# Patient Record
Sex: Female | Born: 1970 | Race: White | Hispanic: No | Marital: Married | Smoking: Former smoker
Health system: Southern US, Community
[De-identification: ages and names within clinical notes are randomized; demographics above are authoritative.]

## PROBLEM LIST (undated history)

## (undated) DIAGNOSIS — M5136 Other intervertebral disc degeneration, lumbar region: Secondary | ICD-10-CM

## (undated) DIAGNOSIS — M51369 Other intervertebral disc degeneration, lumbar region without mention of lumbar back pain or lower extremity pain: Secondary | ICD-10-CM

## (undated) DIAGNOSIS — T7840XA Allergy, unspecified, initial encounter: Secondary | ICD-10-CM

## (undated) DIAGNOSIS — F32A Depression, unspecified: Secondary | ICD-10-CM

## (undated) DIAGNOSIS — E785 Hyperlipidemia, unspecified: Secondary | ICD-10-CM

## (undated) DIAGNOSIS — M1990 Unspecified osteoarthritis, unspecified site: Secondary | ICD-10-CM

## (undated) DIAGNOSIS — K219 Gastro-esophageal reflux disease without esophagitis: Secondary | ICD-10-CM

## (undated) DIAGNOSIS — F419 Anxiety disorder, unspecified: Secondary | ICD-10-CM

## (undated) HISTORY — DX: Gastro-esophageal reflux disease without esophagitis: K21.9

## (undated) HISTORY — DX: Depression, unspecified: F32.A

## (undated) HISTORY — DX: Allergy, unspecified, initial encounter: T78.40XA

## (undated) HISTORY — DX: Other intervertebral disc degeneration, lumbar region: M51.36

## (undated) HISTORY — DX: Unspecified osteoarthritis, unspecified site: M19.90

## (undated) HISTORY — DX: Hyperlipidemia, unspecified: E78.5

## (undated) HISTORY — DX: Anxiety disorder, unspecified: F41.9

## (undated) HISTORY — DX: Other intervertebral disc degeneration, lumbar region without mention of lumbar back pain or lower extremity pain: M51.369

## (undated) HISTORY — PX: HERNIA REPAIR: SHX51

---

## 2013-12-05 LAB — HM PAP SMEAR: HM PAP: NORMAL

## 2016-09-06 ENCOUNTER — Encounter: Payer: Self-pay | Admitting: Family Medicine

## 2016-09-06 ENCOUNTER — Ambulatory Visit (INDEPENDENT_AMBULATORY_CARE_PROVIDER_SITE_OTHER): Payer: 59 | Admitting: Family Medicine

## 2016-09-06 VITALS — BP 123/80 | HR 79 | Temp 98.2°F | Ht 64.3 in | Wt 197.2 lb

## 2016-09-06 DIAGNOSIS — Z23 Encounter for immunization: Secondary | ICD-10-CM | POA: Diagnosis not present

## 2016-09-06 DIAGNOSIS — F411 Generalized anxiety disorder: Secondary | ICD-10-CM | POA: Diagnosis not present

## 2016-09-06 DIAGNOSIS — F419 Anxiety disorder, unspecified: Secondary | ICD-10-CM | POA: Insufficient documentation

## 2016-09-06 MED ORDER — BUSPIRONE HCL 5 MG PO TABS: ORAL_TABLET | ORAL | 1 refills | Status: DC

## 2016-09-06 NOTE — Patient Instructions (Signed)
Tdap Vaccine (Tetanus, Diphtheria and Pertussis): What You Need to Know 1. Why get vaccinated? Tetanus, diphtheria and pertussis are very serious diseases. Tdap vaccine can protect us from these diseases. And, Tdap vaccine given to pregnant women can protect newborn babies against pertussis. TETANUS (Lockjaw) is rare in the [place] today. It causes painful muscle tightening and stiffness, usually all over the body.  It can lead to tightening of muscles in the head and neck so you can't open your mouth, swallow, or sometimes even breathe. Tetanus kills about 1 out of 10 people who are infected even after receiving the best medical care. DIPHTHERIA is also rare in the [place] today. It can cause a thick coating to form in the back of the throat.  It can lead to breathing problems, heart failure, paralysis, and death. PERTUSSIS (Whooping Cough) causes severe coughing spells, which can cause difficulty breathing, vomiting and disturbed sleep.  It can also lead to weight loss, incontinence, and rib fractures. Up to 2 in 100 adolescents and 5 in 100 adults with pertussis are hospitalized or have complications, which could include pneumonia or death. These diseases are caused by bacteria. Diphtheria and pertussis are spread from person to person through secretions from coughing or sneezing. Tetanus enters the body through cuts, scratches, or wounds. Before vaccines, as many as 200,000 cases of diphtheria, 200,000 cases of pertussis, and hundreds of cases of tetanus, were reported in the [place] each year. Since vaccination began, reports of cases for tetanus and diphtheria have dropped by about 99% and for pertussis by about 80%. 2. Tdap vaccine Tdap vaccine can protect adolescents and adults from tetanus, diphtheria, and pertussis. One dose of Tdap is routinely given at age 11 or 12. People who did not get Tdap at that age should get it as soon as possible. Tdap is especially important  for healthcare professionals and anyone having close contact with a baby younger than 12 months. Pregnant women should get a dose of Tdap during every pregnancy, to protect the newborn from pertussis. Infants are most at risk for severe, life-threatening complications from pertussis. Another vaccine, called Td, protects against tetanus and diphtheria, but not pertussis. A Td booster should be given every 10 years. Tdap may be given as one of these boosters if you have never gotten Tdap before. Tdap may also be given after a severe cut or burn to prevent tetanus infection. Your doctor or the person giving you the vaccine can give you more information. Tdap may safely be given at the same time as other vaccines. 3. Some people should not get this vaccine  A person who has ever had a life-threatening allergic reaction after a previous dose of any diphtheria, tetanus or pertussis containing vaccine, OR has a severe allergy to any part of this vaccine, should not get Tdap vaccine. Tell the person giving the vaccine about any severe allergies.  Anyone who had coma or long repeated seizures within 7 days after a childhood dose of DTP or DTaP, or a previous dose of Tdap, should not get Tdap, unless a cause other than the vaccine was found. They can still get Td.  Talk to your doctor if you:  have seizures or another nervous system problem,  had severe pain or swelling after any vaccine containing diphtheria, tetanus or pertussis,  ever had a condition called Guillain-Barr Syndrome (GBS),  aren't feeling well on the day the shot is scheduled. 4. Risks With any medicine, including vaccines, there is   a chance of side effects. These are usually mild and go away on their own. Serious reactions are also possible but are rare. Most people who get Tdap vaccine do not have any problems with it. Mild problems following Tdap: (Did not interfere with activities)  Pain where the shot was given (about 3 in 4  adolescents or 2 in 3 adults)  Redness or swelling where the shot was given (about 1 person in 5)  Mild fever of at least 100.31F (up to about 1 in 25 adolescents or 1 in 100 adults)  Headache (about 3 or 4 people in 10)  Tiredness (about 1 person in 3 or 4)  Nausea, vomiting, diarrhea, stomach ache (up to 1 in 4 adolescents or 1 in 10 adults)  Chills, sore joints (about 1 person in 10)  Body aches (about 1 person in 3 or 4)  Rash, swollen glands (uncommon) Moderate problems following Tdap: (Interfered with activities, but did not require medical attention)  Pain where the shot was given (up to 1 in 5 or 6)  Redness or swelling where the shot was given (up to about 1 in 16 adolescents or 1 in 12 adults)  Fever over 102F (about 1 in 100 adolescents or 1 in 250 adults)  Headache (about 1 in 7 adolescents or 1 in 10 adults)  Nausea, vomiting, diarrhea, stomach ache (up to 1 or 3 people in 100)  Swelling of the entire arm where the shot was given (up to about 1 in 500). Severe problems following Tdap: (Unable to perform usual activities; required medical attention)  Swelling, severe pain, bleeding and redness in the arm where the shot was given (rare). Problems that could happen after any vaccine:  People sometimes faint after a medical procedure, including vaccination. Sitting or lying down for about 15 minutes can help prevent fainting, and injuries caused by a fall. Tell your doctor if you feel dizzy, or have vision changes or ringing in the ears.  Some people get severe pain in the shoulder and have difficulty moving the arm where a shot was given. This happens very rarely.  Any medication can cause a severe allergic reaction. Such reactions from a vaccine are very rare, estimated at fewer than 1 in a million doses, and would happen within a few minutes to a few hours after the vaccination. As with any medicine, there is a very remote chance of a vaccine causing a serious  injury or death. The safety of vaccines is always being monitored. For more information, visit: http://www.aguilar.org/ 5. What if there is a serious problem? What should I look for? Look for anything that concerns you, such as signs of a severe allergic reaction, very high fever, or unusual behavior. Signs of a severe allergic reaction can include hives, swelling of the face and throat, difficulty breathing, a fast heartbeat, dizziness, and weakness. These would usually start a few minutes to a few hours after the vaccination. What should I do?  If you think it is a severe allergic reaction or other emergency that can't wait, call 9-1-1 or get the person to the nearest hospital. Otherwise, call your doctor.  Afterward, the reaction should be reported to the Vaccine Adverse Event Reporting System (VAERS). Your doctor might file this report, or you can do it yourself through the VAERS web site at www.vaers.SamedayNews.es, or by calling [phone].  VAERS does not give medical advice. 6. The National Vaccine Injury Compensation Program The [employer] Vaccine Injury Compensation Program ([place]) is  a federal program that was created to compensate people who may have been injured by certain vaccines. Persons who believe they may have been injured by a vaccine can learn about the program and about filing a claim by calling 279 738 7166 or visiting the [place] website at GoldCloset.com.ee. There is a time limit to file a claim for compensation. 7. How can I learn more?  Ask your doctor. He or she can give you the vaccine package insert or suggest other sources of information.  Call your local or state health department.  Contact the Centers for Disease Control and Prevention (CDC):  Call (414)219-8622 (1-800-CDC-INFO) or  Visit CDC's website at http://hunter.com/ CDC Tdap Vaccine VIS (11/16/13) This information is not intended to replace advice given to you by your health care  provider. Make sure you discuss any questions you have with your health care provider. Document Released: 03/10/2012 Document Revised: 05/30/2016 Document Reviewed: 05/30/2016 Elsevier Interactive Patient Education  2017 [place]. Influenza (Flu) Vaccine (Inactivated or Recombinant): What You Need to Know 1. Why get vaccinated? Influenza ("flu") is a contagious disease that spreads around the [place] every year, usually between October and May. Flu is caused by influenza viruses, and is spread mainly by coughing, sneezing, and close contact. Anyone can get flu. Flu strikes suddenly and can last several days. Symptoms vary by age, but can include:  fever/chills  sore throat  muscle aches  fatigue  cough  headache  runny or stuffy nose Flu can also lead to pneumonia and blood infections, and cause diarrhea and seizures in children. If you have a medical condition, such as heart or lung disease, flu can make it worse. Flu is more dangerous for some people. Infants and young children, people 38 years of age and older, pregnant women, and people with certain health conditions or a weakened immune system are at greatest risk. Each year thousands of people in the [place] die from flu, and many more are hospitalized. Flu vaccine can:  keep you from getting flu,  make flu less severe if you do get it, and  keep you from spreading flu to your family and other people. 2. Inactivated and recombinant flu vaccines A dose of flu vaccine is recommended every flu season. Children 6 months through 58 years of age may need two doses during the same flu season. Everyone else needs only one dose each flu season. Some inactivated flu vaccines contain a very small amount of a mercury-based preservative called thimerosal. Studies have not shown thimerosal in vaccines to be harmful, but flu vaccines that do not contain thimerosal are available. There is no live flu virus in flu shots. They  cannot cause the flu. There are many flu viruses, and they are always changing. Each year a new flu vaccine is made to protect against three or four viruses that are likely to cause disease in the upcoming flu season. But even when the vaccine doesn't exactly match these viruses, it may still provide some protection. Flu vaccine cannot prevent:  flu that is caused by a virus not covered by the vaccine, or  illnesses that look like flu but are not. It takes about 2 weeks for protection to develop after vaccination, and protection lasts through the flu season. 3. Some people should not get this vaccine Tell the person who is giving you the vaccine:  If you have any severe, life-threatening allergies. If you ever had a life-threatening allergic reaction after a dose of flu vaccine, or  have a severe allergy to any part of this vaccine, you may be advised not to get vaccinated. Most, but not all, types of flu vaccine contain a small amount of egg protein.  If you ever had Guillain-Barr Syndrome (also called GBS). Some people with a history of GBS should not get this vaccine. This should be discussed with your doctor.  If you are not feeling well. It is usually okay to get flu vaccine when you have a mild illness, but you might be asked to come back when you feel better. 4. Risks of a vaccine reaction With any medicine, including vaccines, there is a chance of reactions. These are usually mild and go away on their own, but serious reactions are also possible. Most people who get a flu shot do not have any problems with it. Minor problems following a flu shot include:  soreness, redness, or swelling where the shot was given  hoarseness  sore, red or itchy eyes  cough  fever  aches  headache  itching  fatigue If these problems occur, they usually begin soon after the shot and last 1 or 2 days. More serious problems following a flu shot can include the following:  There may be a  small increased risk of Guillain-Barre Syndrome (GBS) after inactivated flu vaccine. This risk has been estimated at 1 or 2 additional cases per million people vaccinated. This is much lower than the risk of severe complications from flu, which can be prevented by flu vaccine.  Young children who get the flu shot along with pneumococcal vaccine (PCV13) and/or DTaP vaccine at the same time might be slightly more likely to have a seizure caused by fever. Ask your doctor for more information. Tell your doctor if a child who is getting flu vaccine has ever had a seizure. Problems that could happen after any injected vaccine:  People sometimes faint after a medical procedure, including vaccination. Sitting or lying down for about 15 minutes can help prevent fainting, and injuries caused by a fall. Tell your doctor if you feel dizzy, or have vision changes or ringing in the ears.  Some people get severe pain in the shoulder and have difficulty moving the arm where a shot was given. This happens very rarely.  Any medication can cause a severe allergic reaction. Such reactions from a vaccine are very rare, estimated at about 1 in a million doses, and would happen within a few minutes to a few hours after the vaccination. As with any medicine, there is a very remote chance of a vaccine causing a serious injury or death. The safety of vaccines is always being monitored. For more information, visit: http://www.aguilar.org/ 5. What if there is a serious reaction? What should I look for? Look for anything that concerns you, such as signs of a severe allergic reaction, very high fever, or unusual behavior. Signs of a severe allergic reaction can include hives, swelling of the face and throat, difficulty breathing, a fast heartbeat, dizziness, and weakness. These would start a few minutes to a few hours after the vaccination. What should I do?  If you think it is a severe allergic reaction or other emergency  that can't wait, call 9-1-1 and get the person to the nearest hospital. Otherwise, call your doctor.  Reactions should be reported to the Vaccine Adverse Event Reporting System (VAERS). Your doctor should file this report, or you can do it yourself through the VAERS web site at www.vaers.SamedayNews.es, or by calling 513-817-6079.  VAERS does not give medical advice. 6. The National Vaccine Injury Compensation Program The [employer] Vaccine Injury Compensation Program (VICP) is a federal program that was created to compensate people who may have been injured by certain vaccines. Persons who believe they may have been injured by a vaccine can learn about the program and about filing a claim by calling (501)502-4657 or visiting the [place] website at GoldCloset.com.ee. There is a time limit to file a claim for compensation. 7. How can I learn more?  Ask your healthcare provider. He or she can give you the vaccine package insert or suggest other sources of information.  Call your local or state health department.  Contact the Centers for Disease Control and Prevention (CDC):  Call 564-123-3515 (1-800-CDC-INFO) or  Visit CDC's website at https://gibson.com/ Vaccine Information Statement, Inactivated Influenza Vaccine (04/29/2014) This information is not intended to replace advice given to you by your health care provider. Make sure you discuss any questions you have with your health care provider. Document Released: 07/04/2006 Document Revised: 05/30/2016 Document Reviewed: 05/30/2016 Elsevier Interactive Patient Education  2017 [employer].

## 2016-09-06 NOTE — Assessment & Plan Note (Signed)
Poor reactions to SSRI/SNRI in the past. Will start buspar. Recheck 1 month. Call with any concerns.

## 2016-09-06 NOTE — Progress Notes (Signed)
BP 123/80 (BP Location: Left Arm, Patient Position: Sitting, Cuff Size: Large)   Pulse 79   Temp 98.2 F (36.8 C)   Ht 5' 4.3" (1.633 m)   Wt 197 lb 3.2 oz (89.4 kg)   LMP 09/06/2016 (Exact Date)   SpO2 98%   BMI 33.53 kg/m    Subjective:    Patient ID: Heidi Taylor, female    DOB: March 29, 1971, 45 y.o.   MRN: [id number]  HPI: Heidi Taylor is a 45 y.o. female  Chief Complaint  Patient presents with  . Establish Care   ANXIETY/STRESS Duration: chronic since she was teenager Status:exacerbated Anxious mood: yes  Excessive worrying: yes Irritability: yes  Sweating: no Nausea: no Palpitations:no Hyperventilation: no Panic attacks: no Agoraphobia: yes  Obscessions/compulsions: yes Depressed mood: yes Depression screen PHQ 2/9 09/06/2016  Decreased Interest 1  Down, Depressed, Hopeless 1  PHQ - 2 Score 2   GAD 7 : Generalized Anxiety Score 09/06/2016  Nervous, Anxious, on Edge 1  Control/stop worrying 0  Worry too much - different things 1  Trouble relaxing 1  Restless 0  Easily annoyed or irritable 1  Afraid - awful might happen 0  Total GAD 7 Score 4  Anxiety Difficulty Somewhat difficult   Anhedonia: no Weight changes: no Insomnia: yes hard to stay asleep  Hypersomnia: no Fatigue/loss of energy: yes Feelings of worthlessness: no Feelings of guilt: no Impaired concentration/indecisiveness: no Suicidal ideations: no  Crying spells: no Recent Stressors/Life Changes: no   Relationship problems: no   Family stress: no     Financial stress: no    Job stress: no    Recent death/loss: no  Active Ambulatory Problems    Diagnosis Date Noted  . Anxiety disorder 09/06/2016   Resolved Ambulatory Problems    Diagnosis Date Noted  . No Resolved Ambulatory Problems   Past Medical History:  Diagnosis Date  . Anxiety   . DDD (degenerative disc disease), lumbar    Past Surgical History:  Procedure Laterality Date  . HERNIA REPAIR     Outpatient  Encounter Prescriptions as of 09/06/2016  Medication Sig  . busPIRone (BUSPAR) 5 MG tablet 1-3 tabs every 8 hours PRN anxiety   No facility-administered encounter medications on file as of 09/06/2016.    Allergies  Allergen Reactions  . Duloxetine Other (See Comments)    GI and weight  . Sertraline Other (See Comments)    GI and weight   Social History   Social History  . Marital status: Married    Spouse name: N/A  . Number of children: N/A  . Years of education: N/A   Occupational History  . Not on file.   Social History Main Topics  . Smoking status: Former Smoker    Quit date: 09/06/2009  . Smokeless tobacco: Never Used  . Alcohol use Yes     Comment: On occasion  . Drug use: No  . Sexual activity: Yes    Birth control/ protection: None   Other Topics Concern  . Not on file   Social History Narrative  . No narrative on file   Family History  Problem Relation Age of Onset  . Cancer Mother     Brain  . Parkinson's disease Father   . Parkinson's disease Paternal Grandfather     Review of Systems  Constitutional: Negative.   Respiratory: Negative.   Cardiovascular: Negative.   Psychiatric/Behavioral: Negative for agitation, behavioral problems, confusion, decreased concentration, dysphoric mood, hallucinations, self-injury, sleep disturbance  and suicidal ideas. The patient is nervous/anxious. The patient is not hyperactive.     Per HPI unless specifically indicated above     Objective:    BP 123/80 (BP Location: Left Arm, Patient Position: Sitting, Cuff Size: Large)   Pulse 79   Temp 98.2 F (36.8 C)   Ht 5' 4.3" (1.633 m)   Wt 197 lb 3.2 oz (89.4 kg)   LMP 09/06/2016 (Exact Date)   SpO2 98%   BMI 33.53 kg/m   Wt Readings from Last 3 Encounters:  09/06/16 197 lb 3.2 oz (89.4 kg)    Physical Exam  Constitutional: She is oriented to person, place, and time. She appears well-developed and well-nourished. No distress.  HENT:  Head: Normocephalic  and atraumatic.  Right Ear: Hearing normal.  Left Ear: Hearing normal.  Nose: Nose normal.  Eyes: Conjunctivae and lids are normal. Right eye exhibits no discharge. Left eye exhibits no discharge. No scleral icterus.  Cardiovascular: Normal rate, regular rhythm, normal heart sounds and intact distal pulses.  Exam reveals no gallop and no friction rub.   No murmur heard. Pulmonary/Chest: Effort normal and breath sounds normal. No respiratory distress. She has no wheezes. She has no rales. She exhibits no tenderness.  Musculoskeletal: Normal range of motion.  Neurological: She is alert and oriented to person, place, and time.  Skin: Skin is warm, dry and intact. No rash noted. She is not diaphoretic. No erythema. No pallor.  Psychiatric: She has a normal mood and affect. Her speech is normal and behavior is normal. Judgment and thought content normal. Cognition and memory are normal.  Nursing note and vitals reviewed.   Results for orders placed or performed in visit on 09/06/16  HM PAP SMEAR  Result Value Ref Range   HM Pap smear Normal       Assessment & Plan:   Problem List Items Addressed This Visit      Other   Anxiety disorder - Primary    Poor reactions to SSRI/SNRI in the past. Will start buspar. Recheck 1 month. Call with any concerns.        Other Visit Diagnoses    Immunization due       Flu and tdap given today.       Follow up plan: Return in about 4 weeks (around 10/04/2016) for Physical.

## 2016-10-04 ENCOUNTER — Ambulatory Visit: Payer: 59 | Admitting: Family Medicine

## 2016-10-29 ENCOUNTER — Ambulatory Visit: Payer: 59 | Admitting: Family Medicine

## 2016-12-06 DIAGNOSIS — M5137 Other intervertebral disc degeneration, lumbosacral region: Secondary | ICD-10-CM | POA: Insufficient documentation

## 2016-12-06 DIAGNOSIS — M51379 Other intervertebral disc degeneration, lumbosacral region without mention of lumbar back pain or lower extremity pain: Secondary | ICD-10-CM | POA: Insufficient documentation

## 2016-12-09 ENCOUNTER — Ambulatory Visit: Payer: 59 | Admitting: Family Medicine

## 2016-12-27 ENCOUNTER — Encounter: Payer: Self-pay | Admitting: Family Medicine

## 2017-01-27 ENCOUNTER — Encounter: Payer: Self-pay | Admitting: Family Medicine

## 2017-01-27 ENCOUNTER — Ambulatory Visit (INDEPENDENT_AMBULATORY_CARE_PROVIDER_SITE_OTHER): Payer: 59 | Admitting: Family Medicine

## 2017-01-27 VITALS — BP 136/84 | HR 103 | Temp 98.5°F | Ht 64.5 in | Wt 203.2 lb

## 2017-01-27 DIAGNOSIS — M5137 Other intervertebral disc degeneration, lumbosacral region: Secondary | ICD-10-CM

## 2017-01-27 DIAGNOSIS — F4321 Adjustment disorder with depressed mood: Secondary | ICD-10-CM

## 2017-01-27 DIAGNOSIS — Z634 Disappearance and death of family member: Secondary | ICD-10-CM | POA: Diagnosis not present

## 2017-01-27 DIAGNOSIS — F411 Generalized anxiety disorder: Secondary | ICD-10-CM

## 2017-01-27 MED ORDER — CLONAZEPAM 0.25 MG PO TBDP: 0.2500 mg | ORAL_TABLET | Freq: Two times a day (BID) | ORAL | 0 refills | Status: DC

## 2017-01-27 MED ORDER — VORTIOXETINE HBR 20 MG PO TABS: ORAL_TABLET | ORAL | 6 refills | Status: DC

## 2017-01-27 MED ORDER — NAPROXEN 500 MG PO TABS: 500.0000 mg | ORAL_TABLET | Freq: Two times a day (BID) | ORAL | 1 refills | Status: DC

## 2017-01-27 NOTE — Assessment & Plan Note (Signed)
Not under good control. Will start trintellix for depression and start klonopin to bridge until it starts kicking in. List of grief counselors given today. Call with any concerns.

## 2017-01-27 NOTE — Patient Instructions (Addendum)
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
## 2017-01-27 NOTE — Assessment & Plan Note (Signed)
Will start naproxen. Call with any concerns.

## 2017-01-29 ENCOUNTER — Telehealth: Payer: Self-pay

## 2017-01-29 NOTE — Telephone Encounter (Signed)
P.A. For Trintellix was initiated via [email or web address]  Heidi Taylor Key: [id number] - PA Case ID: [id number]

## 2017-02-06 ENCOUNTER — Telehealth: Payer: Self-pay | Admitting: Family Medicine

## 2017-02-06 NOTE — Telephone Encounter (Signed)
Pt's husband Doren Custard) called to check on the status of the PA for his wife's Trintellix RX.  Advised pt's husband that the PA was approved on 02/05/17 and provided the approval number.  P

## 2017-03-03 ENCOUNTER — Ambulatory Visit (INDEPENDENT_AMBULATORY_CARE_PROVIDER_SITE_OTHER): Payer: 59 | Admitting: Family Medicine

## 2017-03-03 ENCOUNTER — Encounter: Payer: Self-pay | Admitting: Family Medicine

## 2017-03-03 VITALS — BP 123/84 | HR 78 | Temp 98.5°F | Ht 64.5 in | Wt 206.5 lb

## 2017-03-03 DIAGNOSIS — L237 Allergic contact dermatitis due to plants, except food: Secondary | ICD-10-CM

## 2017-03-03 DIAGNOSIS — Z1231 Encounter for screening mammogram for malignant neoplasm of breast: Secondary | ICD-10-CM | POA: Diagnosis not present

## 2017-03-03 DIAGNOSIS — F411 Generalized anxiety disorder: Secondary | ICD-10-CM | POA: Diagnosis not present

## 2017-03-03 DIAGNOSIS — Z124 Encounter for screening for malignant neoplasm of cervix: Secondary | ICD-10-CM | POA: Diagnosis not present

## 2017-03-03 DIAGNOSIS — Z634 Disappearance and death of family member: Secondary | ICD-10-CM | POA: Diagnosis not present

## 2017-03-03 DIAGNOSIS — Z0000 Encounter for general adult medical examination without abnormal findings: Secondary | ICD-10-CM | POA: Diagnosis not present

## 2017-03-03 DIAGNOSIS — F4321 Adjustment disorder with depressed mood: Secondary | ICD-10-CM | POA: Diagnosis not present

## 2017-03-03 DIAGNOSIS — Z1239 Encounter for other screening for malignant neoplasm of breast: Secondary | ICD-10-CM

## 2017-03-03 LAB — UA/M W/RFLX CULTURE, ROUTINE
Bilirubin, UA: NEGATIVE
Glucose, UA: NEGATIVE
Ketones, UA: NEGATIVE
LEUKOCYTES UA: NEGATIVE
Nitrite, UA: NEGATIVE
PH UA: 7 (ref 5.0–7.5)
Protein, UA: NEGATIVE
Specific Gravity, UA: 1.01 (ref 1.005–1.030)
Urobilinogen, Ur: 0.2 mg/dL (ref 0.2–1.0)

## 2017-03-03 LAB — MICROSCOPIC EXAMINATION
BACTERIA UA: NONE SEEN
RBC MICROSCOPIC, UA: NONE SEEN /HPF (ref 0–?)

## 2017-03-03 MED ORDER — TRIAMCINOLONE ACETONIDE 0.5 % EX OINT: 1.0000 "application " | TOPICAL_OINTMENT | Freq: Two times a day (BID) | CUTANEOUS | 1 refills | Status: DC

## 2017-03-03 NOTE — Assessment & Plan Note (Signed)
Getting better. Did not do well on trintellix. Talking with support group. Call with any concerns. Follow up 3 months.

## 2017-03-03 NOTE — Progress Notes (Signed)
BP 123/84 (BP Location: Left Arm, Patient Position: Sitting, Cuff Size: Large)   Pulse 78   Temp 98.5 F (36.9 C)   Ht 5' 4.5" (1.638 m)   Wt 206 lb 8 oz (93.7 kg)   LMP 02/23/2017   SpO2 100%   Breastfeeding? No   BMI 34.90 kg/m    Subjective:    Patient ID: Heidi Taylor, female    DOB: 09-Sep-1971, 46 y.o.   MRN: [id number]  HPI: Heidi Taylor is a 46 y.o. female presenting on 03/03/2017 for comprehensive medical examination. Current medical complaints include:  DEPRESSION- started on trintellix last visit.  Mood status: better Satisfied with current treatment?: yes Symptom severity: mild  Duration of current treatment : 1 month Side effects: yes- vomiting Medication compliance: poor compliance Psychotherapy/counseling: no  Depressed mood: yes Anxious mood: yes Anhedonia: no Significant weight loss or gain: no Insomnia: no  Fatigue: no Feelings of worthlessness or guilt: no Impaired concentration/indecisiveness: no Suicidal ideations: no Hopelessness: no Crying spells: no Depression screen [facility] 2/9 03/03/2017 01/27/2017 09/06/2016  Decreased Interest 1 3 1   Down, Depressed, Hopeless 1 3 1   PHQ - 2 Score 2 6 2   Altered sleeping - 3 -  Tired, decreased energy - 3 -  Change in appetite - 3 -  Feeling bad or failure about yourself  - 2 -  Trouble concentrating - 3 -  Moving slowly or fidgety/restless - 3 -  Suicidal thoughts - 0 -  PHQ-9 Score - 23 -  Difficult doing work/chores Somewhat difficult - -   RASH Duration:  Couple of days  Location: arms  Itching: yes Burning: yes Redness: yes Oozing: yes Scaling: no Blisters: yes Painful: no Fevers: no Change in detergents/soaps/personal care products: no Recent illness: no Recent travel:no History of same: yes Context: stable Alleviating factors: hydrocortisone cream Treatments attempted:hydrocortisone cream Shortness of breath: no  Throat/tongue swelling: no Myalgias/arthralgias: no  She  currently lives with: husband Menopausal Symptoms: no  Depression Screen done today and results listed below:  Depression screen [facility] 2/9 03/03/2017 01/27/2017 09/06/2016  Decreased Interest 1 3 1   Down, Depressed, Hopeless 1 3 1   PHQ - 2 Score 2 6 2   Altered sleeping - 3 -  Tired, decreased energy - 3 -  Change in appetite - 3 -  Feeling bad or failure about yourself  - 2 -  Trouble concentrating - 3 -  Moving slowly or fidgety/restless - 3 -  Suicidal thoughts - 0 -  PHQ-9 Score - 23 -  Difficult doing work/chores Somewhat difficult - -    Past Medical History:  Past Medical History:  Diagnosis Date  . Anxiety   . DDD (degenerative disc disease), lumbar     Surgical History:  Past Surgical History:  Procedure Laterality Date  . HERNIA REPAIR      Medications:  Current Outpatient Prescriptions on File Prior to Visit  Medication Sig  . clonazePAM (KLONOPIN) 0.25 MG disintegrating tablet Take 1-2 tablets (0.25-0.5 mg total) by mouth 2 (two) times daily.  . naproxen (NAPROSYN) 500 MG tablet Take 1 tablet (500 mg total) by mouth 2 (two) times daily with a meal.   No current facility-administered medications on file prior to visit.     Allergies:  Allergies  Allergen Reactions  . Duloxetine Other (See Comments)    GI and weight  . Sertraline Other (See Comments)    GI and weight  . Trintellix [Vortioxetine] Nausea And Vomiting    Social  History:  Social History   Social History  . Marital status: Married    Spouse name: N/A  . Number of children: N/A  . Years of education: N/A   Occupational History  . Not on file.   Social History Main Topics  . Smoking status: Former Smoker    Quit date: 09/06/2009  . Smokeless tobacco: Never Used  . Alcohol use Yes     Comment: On occasion  . Drug use: No  . Sexual activity: Yes    Birth control/ protection: None   Other Topics Concern  . Not on file   Social History Narrative  . No narrative on file   History   Smoking Status  . Former Smoker  . Quit date: 09/06/2009  Smokeless Tobacco  . Never Used   History  Alcohol Use  . Yes    Comment: On occasion    Family History:  Family History  Problem Relation Age of Onset  . Cancer Mother        Brain  . Parkinson's disease Father   . Parkinson's disease Paternal Grandfather     Past medical history, surgical history, medications, allergies, family history and social history reviewed with patient today and changes made to appropriate areas of the chart.   Review of Systems  Constitutional: Negative.   HENT: Negative.   Eyes: Positive for blurred vision. Negative for double vision, photophobia, pain, discharge and redness.  Respiratory: Negative.   Cardiovascular: Positive for chest pain (just with anxiety). Negative for palpitations, orthopnea, claudication, leg swelling and PND.  Gastrointestinal: Negative.   Genitourinary: Negative.   Musculoskeletal: Positive for back pain. Negative for falls, joint pain, myalgias and neck pain.  Skin: Positive for rash. Negative for itching.  Neurological: Negative.   Endo/Heme/Allergies: Negative.   Psychiatric/Behavioral: Positive for depression. Negative for hallucinations, memory loss, substance abuse and suicidal ideas. The patient is nervous/anxious. The patient does not have insomnia.     All other ROS negative except what is listed above and in the HPI.      Objective:    BP 123/84 (BP Location: Left Arm, Patient Position: Sitting, Cuff Size: Large)   Pulse 78   Temp 98.5 F (36.9 C)   Ht 5' 4.5" (1.638 m)   Wt 206 lb 8 oz (93.7 kg)   LMP 02/23/2017   SpO2 100%   Breastfeeding? No   BMI 34.90 kg/m   Wt Readings from Last 3 Encounters:  03/03/17 206 lb 8 oz (93.7 kg)  01/27/17 203 lb 3.2 oz (92.2 kg)  09/06/16 197 lb 3.2 oz (89.4 kg)    Physical Exam  Constitutional: She is oriented to person, place, and time. She appears well-developed and well-nourished. No distress.    HENT:  Head: Normocephalic and atraumatic.  Right Ear: Hearing, tympanic membrane, external ear and ear canal normal.  Left Ear: Hearing, tympanic membrane, external ear and ear canal normal.  Nose: Nose normal.  Mouth/Throat: Uvula is midline, oropharynx is clear and moist and mucous membranes are normal. No oropharyngeal exudate.  Eyes: Conjunctivae, EOM and lids are normal. Pupils are equal, round, and reactive to light. Right eye exhibits no discharge. Left eye exhibits no discharge. No scleral icterus.  Neck: Normal range of motion. Neck supple. No JVD present. No tracheal deviation present. No thyromegaly present.  Cardiovascular: Normal rate, regular rhythm, normal heart sounds and intact distal pulses.  Exam reveals no gallop and no friction rub.   No murmur heard. Pulmonary/Chest: Effort  normal and breath sounds normal. No stridor. No respiratory distress. She has no wheezes. She has no rales. She exhibits no tenderness. Right breast exhibits no inverted nipple, no mass, no nipple discharge, no skin change and no tenderness. Left breast exhibits no inverted nipple, no mass, no nipple discharge, no skin change and no tenderness. Breasts are symmetrical.  Abdominal: Soft. Bowel sounds are normal. She exhibits no distension and no mass. There is no tenderness. There is no rebound and no guarding. Hernia confirmed negative in the right inguinal area and confirmed negative in the left inguinal area.  Genitourinary: Uterus normal. No labial fusion. There is no rash, tenderness, lesion or injury on the right labia. There is no rash, tenderness, lesion or injury on the left labia. Uterus is not deviated, not enlarged, not fixed and not tender. Cervix exhibits no motion tenderness, no discharge and no friability. Right adnexum displays no mass, no tenderness and no fullness. Left adnexum displays no mass, no tenderness and no fullness. No erythema, tenderness or bleeding in the vagina. No foreign body  in the vagina. No signs of injury around the vagina. Vaginal discharge found.  Musculoskeletal: Normal range of motion. She exhibits no edema, tenderness or deformity.  Lymphadenopathy:    She has no cervical adenopathy.  Neurological: She is alert and oriented to person, place, and time. She has normal reflexes. She displays normal reflexes. No cranial nerve deficit. She exhibits normal muscle tone. Coordination normal.  Skin: Skin is warm, dry and intact. Rash (patch of poisin ivy on R foream and L antecubital fossa) noted. She is not diaphoretic. No erythema. No pallor.  Psychiatric: She has a normal mood and affect. Her speech is normal and behavior is normal. Judgment and thought content normal. Cognition and memory are normal.  Nursing note and vitals reviewed.   Results for orders placed or performed in visit on 09/06/16  HM PAP SMEAR  Result Value Ref Range   HM Pap smear Normal       Assessment & Plan:   Problem List Items Addressed This Visit      Other   Anxiety disorder    Getting better. Did not do well on trintellix. Talking with support group. Call with any concerns. Follow up 3 months.       Grief at loss of child    Getting better. Did not do well on trintellix. Talking with support group. Call with any concerns. Follow up 3 months.        Other Visit Diagnoses    Routine general medical examination at a health care facility    -  Primary   Vaccines up to date. Screening labs checked today. Mammogram ordered today. Pap done today. Call with any concerns.    Relevant Orders   Comprehensive metabolic panel   CBC with Differential/Platelet   Lipid Panel w/o Chol/HDL Ratio   TSH   UA/M w/rflx Culture, Routine   Screening for breast cancer       Mammogram ordered today.   Relevant Orders   MM Digital Screening   Screening for cervical cancer       Pap done today.   Relevant Orders   IGP, Aptima HPV, rfx 16/18,45   Poison ivy       Small patches- will treat  with triamcinalone cream. Call with any concerns.        Follow up plan: Return in about 3 months (around 06/03/2017) for Follow up mood.   LABORATORY TESTING:  -  Pap smear: pap done  IMMUNIZATIONS:   - Tdap: Tetanus vaccination status reviewed: last tetanus booster within 10 years. - Influenza: Up to date - Pneumovax: Not applicable  SCREENING: -Mammogram: Ordered today   PATIENT COUNSELING:   Advised to take 1 mg of folate supplement per day if capable of pregnancy.   Sexuality: Discussed sexually transmitted diseases, partner selection, use of condoms, avoidance of unintended pregnancy  and contraceptive alternatives.   Advised to avoid cigarette smoking.  I discussed with the patient that most people either abstain from alcohol or drink within safe limits (<=14/week and <=4 drinks/occasion for males, <=7/weeks and <= 3 drinks/occasion for females) and that the risk for alcohol disorders and other health effects rises proportionally with the number of drinks per week and how often a drinker exceeds daily limits.  Discussed cessation/primary prevention of drug use and availability of treatment for abuse.   Diet: Encouraged to adjust caloric intake to maintain  or achieve ideal body weight, to reduce intake of dietary saturated fat and total fat, to limit sodium intake by avoiding high sodium foods and not adding table salt, and to maintain adequate dietary potassium and calcium preferably from fresh fruits, vegetables, and low-fat dairy products.    stressed the importance of regular exercise  Injury prevention: Discussed safety belts, safety helmets, smoke detector, smoking near bedding or upholstery.   Dental health: Discussed importance of regular tooth brushing, flossing, and dental visits.    NEXT PREVENTATIVE PHYSICAL DUE IN 1 YEAR. Return in about 3 months (around 06/03/2017) for Follow up mood.

## 2017-03-03 NOTE — Patient Instructions (Addendum)

## 2017-03-04 ENCOUNTER — Encounter: Payer: Self-pay | Admitting: Family Medicine

## 2017-03-04 LAB — CBC WITH DIFFERENTIAL/PLATELET
BASOS ABS: 0.1 10*3/uL (ref 0.0–0.2)
Basos: 1 %
EOS (ABSOLUTE): 0.2 10*3/uL (ref 0.0–0.4)
EOS: 2 %
HEMATOCRIT: 40.6 % (ref 34.0–46.6)
Hemoglobin: 12.7 g/dL (ref 11.1–15.9)
IMMATURE GRANULOCYTES: 0 %
Immature Grans (Abs): 0 10*3/uL (ref 0.0–0.1)
LYMPHS ABS: 3.7 10*3/uL — AB (ref 0.7–3.1)
Lymphs: 30 %
MCH: 25.9 pg — ABNORMAL LOW (ref 26.6–33.0)
MCHC: 31.3 g/dL — AB (ref 31.5–35.7)
MCV: 83 fL (ref 79–97)
MONOS ABS: 0.9 10*3/uL (ref 0.1–0.9)
Monocytes: 7 %
NEUTROS PCT: 60 %
Neutrophils Absolute: 7.4 10*3/uL — ABNORMAL HIGH (ref 1.4–7.0)
PLATELETS: 523 10*3/uL — AB (ref 150–379)
RBC: 4.9 x10E6/uL (ref 3.77–5.28)
RDW: 15.3 % (ref 12.3–15.4)
WBC: 12.3 10*3/uL — AB (ref 3.4–10.8)

## 2017-03-04 LAB — COMPREHENSIVE METABOLIC PANEL
ALBUMIN: 4.6 g/dL (ref 3.5–5.5)
ALK PHOS: 66 IU/L (ref 39–117)
ALT: 17 IU/L (ref 0–32)
AST: 19 IU/L (ref 0–40)
Albumin/Globulin Ratio: 1.5 (ref 1.2–2.2)
BILIRUBIN TOTAL: 0.6 mg/dL (ref 0.0–1.2)
BUN / CREAT RATIO: 13 (ref 9–23)
BUN: 11 mg/dL (ref 6–24)
CHLORIDE: 101 mmol/L (ref 96–106)
CO2: 24 mmol/L (ref 20–29)
Calcium: 10 mg/dL (ref 8.7–10.2)
Creatinine, Ser: 0.85 mg/dL (ref 0.57–1.00)
GFR calc Af Amer: 96 mL/min/{1.73_m2} (ref >59)
GFR calc non Af Amer: 83 mL/min/{1.73_m2} (ref >59)
GLOBULIN, TOTAL: 3.1 g/dL (ref 1.5–4.5)
Glucose: 80 mg/dL (ref 65–99)
POTASSIUM: 4.2 mmol/L (ref 3.5–5.2)
SODIUM: 140 mmol/L (ref 134–144)
Total Protein: 7.7 g/dL (ref 6.0–8.5)

## 2017-03-04 LAB — LIPID PANEL W/O CHOL/HDL RATIO
Cholesterol, Total: 222 mg/dL — ABNORMAL HIGH (ref 100–199)
HDL: 59 mg/dL (ref >39)
LDL Calculated: 147 mg/dL — ABNORMAL HIGH (ref 0–99)
TRIGLYCERIDES: 82 mg/dL (ref 0–149)
VLDL Cholesterol Cal: 16 mg/dL (ref 5–40)

## 2017-03-04 LAB — TSH: TSH: 3.05 u[IU]/mL (ref 0.450–4.500)

## 2017-03-05 LAB — IGP, APTIMA HPV, RFX 16/18,45
HPV APTIMA: NEGATIVE
PAP Smear Comment: 0

## 2017-04-23 ENCOUNTER — Encounter: Payer: Self-pay | Admitting: Family Medicine

## 2017-04-23 MED ORDER — CLONAZEPAM 0.25 MG PO TBDP: 0.2500 mg | ORAL_TABLET | Freq: Two times a day (BID) | ORAL | 0 refills | Status: DC

## 2017-04-23 NOTE — Telephone Encounter (Signed)
OK to call in her Klonopin

## 2017-04-23 NOTE — Telephone Encounter (Signed)
Medication called into [facility] in [place].

## 2017-04-29 ENCOUNTER — Other Ambulatory Visit: Payer: Self-pay

## 2017-04-29 ENCOUNTER — Encounter: Payer: Self-pay | Admitting: Family Medicine

## 2017-04-29 DIAGNOSIS — Z021 Encounter for pre-employment examination: Secondary | ICD-10-CM

## 2017-05-09 ENCOUNTER — Other Ambulatory Visit: Payer: 59

## 2017-05-09 DIAGNOSIS — Z021 Encounter for pre-employment examination: Secondary | ICD-10-CM

## 2017-05-09 LAB — BAYER DCA HB A1C WAIVED: HB A1C: 5.3 % (ref <7.0)

## 2017-05-20 ENCOUNTER — Encounter: Payer: Self-pay | Admitting: Family Medicine

## 2017-05-20 NOTE — Telephone Encounter (Signed)
Please advise 

## 2017-05-29 ENCOUNTER — Encounter: Payer: Self-pay | Admitting: Family Medicine

## 2017-05-30 MED ORDER — CLONAZEPAM 0.5 MG PO TABS: 0.2500 mg | ORAL_TABLET | Freq: Two times a day (BID) | ORAL | 0 refills | Status: DC | PRN

## 2017-05-30 MED ORDER — CLONAZEPAM 0.5 MG PO TBDP: 0.2500 mg | ORAL_TABLET | Freq: Two times a day (BID) | ORAL | 0 refills | Status: DC

## 2017-05-30 MED ORDER — CLONAZEPAM 0.25 MG PO TBDP: 0.2500 mg | ORAL_TABLET | Freq: Two times a day (BID) | ORAL | 0 refills | Status: DC

## 2017-05-30 NOTE — Telephone Encounter (Signed)
OK to call in

## 2017-05-30 NOTE — Addendum Note (Signed)
Addended by: Valerie Roys on: 05/30/2017 09:15 AM   Modules accepted: Orders

## 2017-05-30 NOTE — Telephone Encounter (Signed)
Medication called into [facility].  Only had 0.82m tablets that were not disintegrating, verbal from Dr. JWynetta Emeryto fill those with 90 and to take 1/2 to 1 tablet twice a a day.

## 2017-06-03 ENCOUNTER — Ambulatory Visit (INDEPENDENT_AMBULATORY_CARE_PROVIDER_SITE_OTHER): Payer: 59 | Admitting: Family Medicine

## 2017-06-03 ENCOUNTER — Encounter: Payer: Self-pay | Admitting: Family Medicine

## 2017-06-03 VITALS — BP 132/85 | HR 98 | Wt 207.4 lb

## 2017-06-03 DIAGNOSIS — F411 Generalized anxiety disorder: Secondary | ICD-10-CM | POA: Diagnosis not present

## 2017-06-03 DIAGNOSIS — F4321 Adjustment disorder with depressed mood: Secondary | ICD-10-CM

## 2017-06-03 DIAGNOSIS — Z634 Disappearance and death of family member: Secondary | ICD-10-CM

## 2017-06-03 NOTE — Assessment & Plan Note (Signed)
Stable. Cannot tolerate counseling right now. Text based counseling options given to patient today. Continue klonopin. Recheck 3 months. Referral to psychiatry made today.

## 2017-06-03 NOTE — Progress Notes (Signed)
BP 132/85 (BP Location: Left Arm, Patient Position: Sitting, Cuff Size: Normal)   Pulse 98   Wt 207 lb 7 oz (94.1 kg)   SpO2 97%   BMI 35.06 kg/m    Subjective:    Patient ID: Heidi Taylor, female    DOB: Feb 01, 1971, 46 y.o.   MRN: [id number]  HPI: Heidi Taylor is a 46 y.o. female  Chief Complaint  Patient presents with  . Anxiety   ANXIETY/STRESS- doing OK, about 1/2 bad days and 1/2 good days. Has been taking 1/2 a pill every 3-4 hours Duration:stable Anxious mood: yes  Excessive worrying: yes Irritability: yes  Sweating: no Nausea: no Palpitations:no Hyperventilation: no Panic attacks: no Agoraphobia: no  Obscessions/compulsions: yes Depressed mood: yes Depression screen [facility] 2/9 06/03/2017 03/03/2017 01/27/2017 09/06/2016  Decreased Interest 1 1 3 1   Down, Depressed, Hopeless 1 1 3 1   PHQ - 2 Score 2 2 6 2   Altered sleeping - - 3 -  Tired, decreased energy - - 3 -  Change in appetite - - 3 -  Feeling bad or failure about yourself  - - 2 -  Trouble concentrating - - 3 -  Moving slowly or fidgety/restless - - 3 -  Suicidal thoughts - - 0 -  PHQ-9 Score - - 23 -  Difficult doing work/chores - Somewhat difficult - -   GAD 7 : Generalized Anxiety Score 06/03/2017 01/27/2017 09/06/2016  Nervous, Anxious, on Edge 2 3 1   Control/stop worrying 1 3 0  Worry too much - different things 1 3 1   Trouble relaxing 1 3 1   Restless 0 2 0  Easily annoyed or irritable 1 3 1   Afraid - awful might happen 0 3 0  Total GAD 7 Score 6 20 4   Anxiety Difficulty Very difficult Very difficult Somewhat difficult   Anhedonia: yes Weight changes: no Insomnia: no   Hypersomnia: no Fatigue/loss of energy: yes Feelings of worthlessness: no Feelings of guilt: yes Impaired concentration/indecisiveness: yes Suicidal ideations: no  Crying spells: yes Recent Stressors/Life Changes: yes   Relationship problems: no   Family stress: yes     Financial stress: no    Job stress: no   Recent death/loss: yes   Relevant past medical, surgical, family and social history reviewed and updated as indicated. Interim medical history since our last visit reviewed. Allergies and medications reviewed and updated.  Review of Systems  Constitutional: Negative.   Respiratory: Negative.   Cardiovascular: Negative.   Psychiatric/Behavioral: Positive for decreased concentration and dysphoric mood. Negative for agitation, behavioral problems, confusion, hallucinations, self-injury, sleep disturbance and suicidal ideas. The patient is nervous/anxious. The patient is not hyperactive.     Per HPI unless specifically indicated above     Objective:    BP 132/85 (BP Location: Left Arm, Patient Position: Sitting, Cuff Size: Normal)   Pulse 98   Wt 207 lb 7 oz (94.1 kg)   SpO2 97%   BMI 35.06 kg/m   Wt Readings from Last 3 Encounters:  06/03/17 207 lb 7 oz (94.1 kg)  03/03/17 206 lb 8 oz (93.7 kg)  01/27/17 203 lb 3.2 oz (92.2 kg)    Physical Exam  Constitutional: She is oriented to person, place, and time. She appears well-developed and well-nourished. No distress.  HENT:  Head: Normocephalic and atraumatic.  Right Ear: Hearing normal.  Left Ear: Hearing normal.  Nose: Nose normal.  Eyes: Conjunctivae and lids are normal. Right eye exhibits no discharge. Left eye exhibits no  discharge. No scleral icterus.  Cardiovascular: Normal rate, regular rhythm, normal heart sounds and intact distal pulses.  Exam reveals no gallop and no friction rub.   No murmur heard. Pulmonary/Chest: Effort normal and breath sounds normal. No respiratory distress. She has no wheezes. She has no rales. She exhibits no tenderness.  Musculoskeletal: Normal range of motion.  Neurological: She is alert and oriented to person, place, and time.  Skin: Skin is warm, dry and intact. No rash noted. She is not diaphoretic. No erythema. No pallor.  Psychiatric: She has a normal mood and affect. Her speech is normal  and behavior is normal. Judgment and thought content normal. Cognition and memory are normal.  Nursing note and vitals reviewed.   Results for orders placed or performed in visit on 05/09/17  Bayer DCA Hb A1c Waived  Result Value Ref Range   Bayer DCA Hb A1c Waived 5.3 <7.0 %      Assessment & Plan:   Problem List Items Addressed This Visit      Other   Anxiety disorder - Primary    Stable. Cannot tolerate counseling right now. Text based counseling options given to patient today. Continue klonopin. Recheck 3 months. Referral to psychiatry made today.      Relevant Orders   Ambulatory referral to Psychiatry   Grief at loss of child    Stable. Cannot tolerate counseling right now. Text based counseling options given to patient today. Continue klonopin. Recheck 3 months. Referral to psychiatry made today.      Relevant Orders   Ambulatory referral to Psychiatry       Follow up plan: Return in about 3 months (around 09/02/2017).

## 2017-06-03 NOTE — Patient Instructions (Signed)
[email or web address]

## 2017-08-20 ENCOUNTER — Encounter: Payer: Self-pay | Admitting: Family Medicine

## 2017-09-02 ENCOUNTER — Encounter: Payer: Self-pay | Admitting: Family Medicine

## 2017-09-02 ENCOUNTER — Ambulatory Visit (INDEPENDENT_AMBULATORY_CARE_PROVIDER_SITE_OTHER): Payer: 59 | Admitting: Family Medicine

## 2017-09-02 VITALS — BP 120/84 | HR 91 | Temp 98.7°F | Wt 215.3 lb

## 2017-09-02 DIAGNOSIS — F411 Generalized anxiety disorder: Secondary | ICD-10-CM | POA: Diagnosis not present

## 2017-09-02 DIAGNOSIS — Z6836 Body mass index (BMI) 36.0-36.9, adult: Secondary | ICD-10-CM | POA: Diagnosis not present

## 2017-09-02 DIAGNOSIS — E6609 Other obesity due to excess calories: Secondary | ICD-10-CM

## 2017-09-02 DIAGNOSIS — F4321 Adjustment disorder with depressed mood: Secondary | ICD-10-CM | POA: Diagnosis not present

## 2017-09-02 DIAGNOSIS — Z634 Disappearance and death of family member: Secondary | ICD-10-CM | POA: Diagnosis not present

## 2017-09-02 NOTE — Assessment & Plan Note (Signed)
Stable. Doing a bit better. Not interested in counseling. Did poorly with SSRIs. Call with any concerns.

## 2017-09-02 NOTE — Progress Notes (Signed)
BP 120/84 (BP Location: Left Arm, Patient Position: Sitting, Cuff Size: Large)   Pulse 91   Temp 98.7 F (37.1 C)   Wt 215 lb 5 oz (97.7 kg)   SpO2 98%   BMI 36.39 kg/m    Subjective:    Patient ID: Heidi Taylor, female    DOB: Nov 05, 1970, 46 y.o.   MRN: [id number]  HPI: Heidi Taylor is a 46 y.o. female  Chief Complaint  Patient presents with  . Anxiety  . Depression   ANXIETY/DEPRESSION- doing a bit better. Now just taking medicine about 1-2x a day, mainly for sleep and at work,  Duration:better Anxious mood: yes  Excessive worrying: yes Irritability: yes  Sweating: no Nausea: no Palpitations:no Hyperventilation: no Panic attacks: no Agoraphobia: no  Obscessions/compulsions: no Depressed mood: yes Depression screen [facility] 2/9 09/02/2017 06/03/2017 03/03/2017 01/27/2017 09/06/2016  Decreased Interest 1 1 1 3 1   Down, Depressed, Hopeless 1 1 1 3 1   PHQ - 2 Score 2 2 2 6 2   Altered sleeping 1 - - 3 -  Tired, decreased energy 1 - - 3 -  Change in appetite 1 - - 3 -  Feeling bad or failure about yourself  0 - - 2 -  Trouble concentrating 0 - - 3 -  Moving slowly or fidgety/restless 0 - - 3 -  Suicidal thoughts 0 - - 0 -  PHQ-9 Score 5 - - 23 -  Difficult doing work/chores Not difficult at all - Somewhat difficult - -   Anhedonia: no Weight changes: yes Insomnia: yes hard to stay asleep  Hypersomnia: no Fatigue/loss of energy: yes Feelings of worthlessness: yes Feelings of guilt: yes Impaired concentration/indecisiveness: no Suicidal ideations: no  Crying spells: yes Recent Stressors/Life Changes: yes   Relationship problems: no   Family stress: yes     Financial stress: no    Job stress: no    Recent death/loss: yes  WEIGHT GAIN Duration: Past few months Previous attempts at weight loss: diet and exercise Complications of obesity: none Peak weight: current Weight loss goal: to be healthy Weight loss to date: none Requesting obesity pharmacotherapy:  no Current weight loss supplements/medications: no Previous weight loss supplements/meds: no  Relevant past medical, surgical, family and social history reviewed and updated as indicated. Interim medical history since our last visit reviewed. Allergies and medications reviewed and updated.  Review of Systems  Constitutional: Negative.   Respiratory: Negative.   Cardiovascular: Negative.   Psychiatric/Behavioral: Negative.     Per HPI unless specifically indicated above     Objective:    BP 120/84 (BP Location: Left Arm, Patient Position: Sitting, Cuff Size: Large)   Pulse 91   Temp 98.7 F (37.1 C)   Wt 215 lb 5 oz (97.7 kg)   SpO2 98%   BMI 36.39 kg/m   Wt Readings from Last 3 Encounters:  09/02/17 215 lb 5 oz (97.7 kg)  06/03/17 207 lb 7 oz (94.1 kg)  03/03/17 206 lb 8 oz (93.7 kg)    Physical Exam  Constitutional: She is oriented to person, place, and time. She appears well-developed and well-nourished. No distress.  HENT:  Head: Normocephalic and atraumatic.  Right Ear: Hearing normal.  Left Ear: Hearing normal.  Nose: Nose normal.  Eyes: Conjunctivae and lids are normal. Right eye exhibits no discharge. Left eye exhibits no discharge. No scleral icterus.  Cardiovascular: Normal rate, regular rhythm, normal heart sounds and intact distal pulses. Exam reveals no gallop and no friction  rub.  No murmur heard. Pulmonary/Chest: Effort normal and breath sounds normal. No respiratory distress. She has no wheezes. She has no rales. She exhibits no tenderness.  Musculoskeletal: Normal range of motion.  Neurological: She is alert and oriented to person, place, and time.  Skin: Skin is warm, dry and intact. No rash noted. She is not diaphoretic. No erythema. No pallor.  Psychiatric: She has a normal mood and affect. Her speech is normal and behavior is normal. Judgment and thought content normal. Cognition and memory are normal.  Nursing note and vitals reviewed.   Results  for orders placed or performed in visit on 05/09/17  Bayer DCA Hb A1c Waived  Result Value Ref Range   Bayer DCA Hb A1c Waived 5.3 <7.0 %      Assessment & Plan:   Problem List Items Addressed This Visit      Other   Anxiety disorder    Stable. Taking clonazepam less often. Will continue current regimen. 2/3 bottle left- will call for refill. OK to refill if lasting 3+ months      Grief at loss of child - Primary    Stable. Doing a bit better. Not interested in counseling. Did poorly with SSRIs. Call with any concerns.        Other Visit Diagnoses    Class 2 obesity due to excess calories without serious comorbidity with body mass index (BMI) of 36.0 to 36.9 in adult       Discussed sleep and exercise and portion size. Will look into belviq and contrave and call if she's interested in either.        Follow up plan: Return After 03/03/17, for Physical.

## 2017-09-02 NOTE — Assessment & Plan Note (Signed)
Stable. Taking clonazepam less often. Will continue current regimen. 2/3 bottle left- will call for refill. OK to refill if lasting 3+ months

## 2017-09-02 NOTE — Patient Instructions (Addendum)
Lorcaserin extended-release tablets What is this medicine? LORCASERIN (lor ca SER in) is used to promote and maintain weight loss in obese patients. This medicine should be used with a reduced calorie diet and, if appropriate, an exercise program. This medicine may be used for other purposes; ask your health care provider or pharmacist if you have questions. COMMON BRAND NAME(S): Belviq XR What should I tell my health care provider before I take this medicine? They need to know if you have any of these conditions: -anatomical deformation of the penis, Peyronie's disease, or history of priapism (painful and prolonged erection) -diabetes -heart disease -history of blood diseases, like sickle cell anemia or leukemia -history of irregular heartbeat -kidney disease -liver disease -suicidal thoughts, plans, or attempt; a previous suicide attempt by you or a family member -an unusual or allergic reaction to lorcaserin, other medicines, foods, dyes, or preservatives -pregnant or trying to get pregnant -breast-feeding How should I use this medicine? Take this medicine by mouth with a glass of water. Follow the directions on the prescription label. Do not cut, crush or chew this medicine. You can take it with or without food. Take your medicine at regular intervals. Do not take it more often than directed. Do not stop taking except on your doctor's advice. Talk to your pediatrician regarding the use of this medicine in children. Special care may be needed. Overdosage: If you think you have taken too much of this medicine contact a poison control center or emergency room at once. NOTE: This medicine is only for you. Do not share this medicine with others. What if I miss a dose? If you miss a dose, take it as soon as you can. If it is almost time for your next dose, take only that dose. Do not take double or extra doses. What may interact with this medicine? -cabergoline -certain medicines for  depression, anxiety, or psychotic disturbances -certain medicines for erectile dysfunction -certain medicines for migraine headache like almotriptan, eletriptan, frovatriptan, naratriptan, rizatriptan, sumatriptan, zolmitriptan -dextromethorphan -linezolid -lithium -medicines for diabetes -other weight loss products -tramadol -St. John's Wort -stimulant medicines for attention disorders, weight loss, or to stay awake -tryptophan This list may not describe all possible interactions. Give your health care provider a list of all the medicines, herbs, non-prescription drugs, or dietary supplements you use. Also tell them if you smoke, drink alcohol, or use illegal drugs. Some items may interact with your medicine. What should I watch for while using this medicine? This medicine is intended to be used in addition to a healthy diet and appropriate exercise. The best results are achieved this way. Your doctor should instruct you to stop using this medicine if you do not lose a certain amount of weight within the first 12 weeks of treatment, but it is important that you do not change your dose in any way without consulting your doctor or health care professional. Visit your doctor or health care professional for regular checkups. Your doctor may order blood tests or other tests to see how you are doing. Do not drive, use machinery, or do anything that needs mental alertness until you know how this medicine affects you. This medicine may affect blood sugar levels. If you have diabetes, check with your doctor or health care professional before you change your diet or the dose of your diabetic medicine. Patients and their families should watch out for worsening depression or thoughts of suicide. Also watch out for sudden changes in feelings such as feeling anxious,  agitated, panicky, irritable, hostile, aggressive, impulsive, severely restless, overly excited and hyperactive, or not being able to sleep. If  this happens, especially at the beginning of treatment or after a change in dose, call your health care professional. Contact your doctor or health care professional right away if you are a man with an erection that lasts longer than 4 hours or if the erection becomes painful. This may be a sign of serious problem and must be treated right away to prevent permanent damage. What side effects may I notice from receiving this medicine? Side effects that you should report to your doctor or health care professional as soon as possible: -allergic reactions like skin rash, itching or hives, swelling of the face, lips, or tongue -abnormal production of milk -breast enlargement in both males and females -breathing problems -changes in emotions or moods -changes in vision -confusion -erection lasting more than 4 hours or a painful erection -fast or irregular heart beat -feeling faint or lightheaded, falls -fever or chills, sore throat -hallucination, loss of contact with reality -high or low blood pressure -menstrual changes -restlessness -signs and symptoms of low blood sugar such as feeling anxious; confusion; dizziness; increased hunger; unusually weak or tired; sweating; shakiness; cold; irritable; headache; blurred vision; fast heartbeat; loss of consciousness -slow or irregular heartbeat -stiff muscles -sweating -suicidal thoughts or actions -swelling of the ankles, feet, hands -unusually weak or tired -vomiting Side effects that usually do not require medical attention (report to your doctor or health care professional if they continue or are bothersome): -back pain -constipation -cough -dry mouth -nausea -tiredness This list may not describe all possible side effects. Call your doctor for medical advice about side effects. You may report side effects to FDA at 1-800-FDA-1088. Where should I keep my medicine? Keep out of the reach of children. This medicine can be abused. Keep your  medicine in a safe place to protect it from theft. Do not share this medicine with anyone. Selling or giving away this medicine is dangerous and against the law. Store at room temperature between 15 and 30 degrees C (59 and 86 degrees F). Throw away any unused medicine after the expiration date. NOTE: This sheet is a summary. It may not cover all possible information. If you have questions about this medicine, talk to your doctor, pharmacist, or health care provider.  2018 Elsevier/Gold Standard (2015-10-11 16:24:54) Bupropion; Naltrexone extended-release tablets What is this medicine? BUPROPION; NALTREXONE (byoo PROE pee on; nal TREX one) is a combination product used to promote and maintain weight loss in obese adults or overweight adults who also have weight related medical problems. This medicine should be used with a reduced calorie diet and increased physical activity. This medicine may be used for other purposes; ask your health care provider or pharmacist if you have questions. COMMON BRAND NAME(S): CONTRAVE What should I tell my health care provider before I take this medicine? They need to know if you have any of these conditions: -an eating disorder, such as anorexia or bulimia -bipolar disorder -diabetes -depression -drug abuse or addiction -glaucoma -head injury -heart disease -high blood pressure -history of a tumor or infection of your brain or spine -history of stroke -history of irregular heartbeat -if you often drink alcohol -kidney disease -liver disease -schizophrenia -seizures -suicidal thoughts, plans, or attempt; a previous suicide attempt by you or a family member -an unusual or allergic reaction to bupropion, naltrexone, other medicines, foods, dyes, or preservatives -breast-feeding -pregnant or trying to become pregnant  How should I use this medicine? Take this medicine by mouth with a glass of water. Follow the directions on the prescription label. Take  this medicine in the morning and in the evenings as directed by your healthcare professional. Dennis Bast can take it with or without food. Do not take with high-fat meals as this may increase your risk of seizures. Do not crush, chew, or cut these tablets. Do not take your medicine more often than directed. Do not stop taking this medicine suddenly except upon the advice of your doctor. A special MedGuide will be given to you by the pharmacist with each prescription and refill. Be sure to read this information carefully each time. Talk to your pediatrician regarding the use of this medicine in children. Special care may be needed. Overdosage: If you think you have taken too much of this medicine contact a poison control center or emergency room at once. NOTE: This medicine is only for you. Do not share this medicine with others. What if I miss a dose? If you miss a dose, skip the missed dose and take your next tablet at the regular time. Do not take double or extra doses. What may interact with this medicine? Do not take this medicine with any of the following medications: -any prescription or street opioid drug like codeine, heroin, methadone -linezolid -MAOIs like Carbex, Eldepryl, Marplan, Nardil, and Parnate -methylene blue (injected into a vein) -other medicines that contain bupropion like Zyban or Wellbutrin This medicine may also interact with the following medications: -alcohol -certain medicines for anxiety or sleep -certain medicines for blood pressure like metoprolol, propranolol -certain medicines for depression or psychotic disturbances -certain medicines for HIV or AIDS like efavirenz, lopinavir, nelfinavir, ritonavir -certain medicines for irregular heart beat like propafenone, flecainide -certain medicines for Parkinson's disease like amantadine, levodopa -certain medicines for seizures like carbamazepine, phenytoin,  phenobarbital -cimetidine -clopidogrel -cyclophosphamide -digoxin -disulfiram -furazolidone -isoniazid -nicotine -orphenadrine -procarbazine -steroid medicines like prednisone or cortisone -stimulant medicines for attention disorders, weight loss, or to stay awake -tamoxifen -theophylline -thioridazine -thiotepa -ticlopidine -tramadol -warfarin This list may not describe all possible interactions. Give your health care provider a list of all the medicines, herbs, non-prescription drugs, or dietary supplements you use. Also tell them if you smoke, drink alcohol, or use illegal drugs. Some items may interact with your medicine. What should I watch for while using this medicine? This medicine is intended to be used in addition to a healthy diet and appropriate exercise. The best results are achieved this way. Do not increase or in any way change your dose without consulting your doctor or health care professional. Do not take this medicine with other prescription or over-the-counter weight loss products without consulting your doctor or health care professional. Your doctor should tell you to stop taking this medicine if you do not lose a certain amount of weight within the first 12 weeks of treatment. Visit your doctor or health care professional for regular checkups. Your doctor may order blood tests or other tests to see how you are doing. This medicine may affect blood sugar levels. If you have diabetes, check with your doctor or health care professional before you change your diet or the dose of your diabetic medicine. Patients and their families should watch out for new or worsening depression or thoughts of suicide. Also watch out for sudden changes in feelings such as feeling anxious, agitated, panicky, irritable, hostile, aggressive, impulsive, severely restless, overly excited and hyperactive, or not being able to  sleep. If this happens, especially at the beginning of treatment or  after a change in dose, call your health care professional. Avoid alcoholic drinks while taking this medicine. Drinking large amounts of alcoholic beverages, using sleeping or anxiety medicines, or quickly stopping the use of these agents while taking this medicine may increase your risk for a seizure. What side effects may I notice from receiving this medicine? Side effects that you should report to your doctor or health care professional as soon as possible: -allergic reactions like skin rash, itching or hives, swelling of the face, lips, or tongue -breathing problems -changes in vision -confusion -elevated mood, decreased need for sleep, racing thoughts, impulsive behavior -fast or irregular heartbeat -hallucinations, loss of contact with reality -increased blood pressure -redness, blistering, peeling or loosening of the skin, including inside the mouth -seizures -signs and symptoms of liver injury like dark yellow or brown urine; general ill feeling or flu-like symptoms; light-colored stools; loss of appetite; nausea; right upper belly pain; unusually weak or tired; yellowing of the eyes or skin -suicidal thoughts or other mood changes -vomiting Side effects that usually do not require medical attention (report to your doctor or health care professional if they continue or are bothersome): -constipation -headache -loss of appetite -indigestion, stomach upset -tremors This list may not describe all possible side effects. Call your doctor for medical advice about side effects. You may report side effects to FDA at 1-800-FDA-1088. Where should I keep my medicine? Keep out of the reach of children. Store at room temperature between 15 and 30 degrees C (59 and 86 degrees F). Throw away any unused medicine after the expiration date. NOTE: This sheet is a summary. It may not cover all possible information. If you have questions about this medicine, talk to your doctor, pharmacist, or health  care provider.  2018 Elsevier/Gold Standard (2016-03-01 13:42:58)

## 2017-10-01 ENCOUNTER — Encounter: Payer: Self-pay | Admitting: Family Medicine

## 2017-10-01 ENCOUNTER — Other Ambulatory Visit: Payer: Self-pay

## 2017-10-01 ENCOUNTER — Ambulatory Visit (INDEPENDENT_AMBULATORY_CARE_PROVIDER_SITE_OTHER): Payer: Managed Care, Other (non HMO) | Admitting: Family Medicine

## 2017-10-01 VITALS — BP 133/84 | HR 97 | Temp 97.5°F | Wt 212.1 lb

## 2017-10-01 DIAGNOSIS — H65192 Other acute nonsuppurative otitis media, left ear: Secondary | ICD-10-CM | POA: Diagnosis not present

## 2017-10-01 MED ORDER — PREDNISONE 50 MG PO TABS: 50.0000 mg | ORAL_TABLET | Freq: Every day | ORAL | 0 refills | Status: DC

## 2017-10-01 NOTE — Progress Notes (Signed)
BP 133/84 (BP Location: Left Arm, Patient Position: Sitting, Cuff Size: Normal)   Pulse 97   Temp (!) 97.5 F (36.4 C) (Oral)   Wt 212 lb 1.6 oz (96.2 kg)   SpO2 97%   BMI 35.84 kg/m    Subjective:    Patient ID: Heidi Taylor, female    DOB: 1970/11/25, 47 y.o.   MRN: [id number]  HPI: Heidi Taylor is a 47 y.o. female  Chief Complaint  Patient presents with  . Ear Pain    Left is worse than left. Did E-Visit and was given amoxicillin. Ear seems muffled and painful. Hard time sleeping.  . Nasal Congestion    Is taking Sudafed and Flonase.   EAR PAIN Duration: 1.5 weeks Involved ear(s): left Severity:  severe  Quality:  stabbing Fever: no Otorrhea: no Upper respiratory infection symptoms: yes Pruritus: yes Hearing loss: yes Water immersion no Using Q-tips: no Recurrent otitis media: no Status: worse Treatments attempted: flonase, sudafed, amoxicillin  Relevant past medical, surgical, family and social history reviewed and updated as indicated. Interim medical history since our last visit reviewed. Allergies and medications reviewed and updated.  Review of Systems  Constitutional: Negative.   HENT: Positive for congestion, ear pain, postnasal drip, rhinorrhea and sore throat. Negative for dental problem, drooling, ear discharge, facial swelling, hearing loss, mouth sores, nosebleeds, sinus pressure, sinus pain, sneezing, tinnitus, trouble swallowing and voice change.   Eyes: Negative.   Respiratory: Negative.   Cardiovascular: Negative.   Psychiatric/Behavioral: Negative.     Per HPI unless specifically indicated above     Objective:    BP 133/84 (BP Location: Left Arm, Patient Position: Sitting, Cuff Size: Normal)   Pulse 97   Temp (!) 97.5 F (36.4 C) (Oral)   Wt 212 lb 1.6 oz (96.2 kg)   SpO2 97%   BMI 35.84 kg/m   Wt Readings from Last 3 Encounters:  10/01/17 212 lb 1.6 oz (96.2 kg)  09/02/17 215 lb 5 oz (97.7 kg)  06/03/17 207 lb 7 oz (94.1  kg)    Physical Exam  Constitutional: She is oriented to person, place, and time. She appears well-developed and well-nourished. No distress.  HENT:  Head: Normocephalic and atraumatic.  Right Ear: Hearing, tympanic membrane, external ear and ear canal normal. Tympanic membrane is not erythematous, not retracted and not bulging.  Left Ear: Hearing, external ear and ear canal normal. Tympanic membrane is bulging. Tympanic membrane is not erythematous and not retracted. A middle ear effusion is present.  Nose: Nose normal.  Mouth/Throat: Oropharynx is clear and moist. No oropharyngeal exudate.  Eyes: Conjunctivae, EOM and lids are normal. Pupils are equal, round, and reactive to light. Right eye exhibits no discharge. Left eye exhibits no discharge. No scleral icterus.  Neck: Normal range of motion. Neck supple. No JVD present. No tracheal deviation present. No thyromegaly present.  Cardiovascular: Normal rate, regular rhythm, normal heart sounds and intact distal pulses. Exam reveals no gallop and no friction rub.  No murmur heard. Pulmonary/Chest: Effort normal and breath sounds normal. No stridor. No respiratory distress. She has no wheezes. She has no rales. She exhibits no tenderness.  Musculoskeletal: Normal range of motion.  Lymphadenopathy:    She has no cervical adenopathy.  Neurological: She is alert and oriented to person, place, and time.  Skin: Skin is warm, dry and intact. No rash noted. She is not diaphoretic. No erythema. No pallor.  Psychiatric: She has a normal mood and affect. Her speech  is normal and behavior is normal. Judgment and thought content normal. Cognition and memory are normal.  Nursing note and vitals reviewed.   Results for orders placed or performed in visit on 05/09/17  Bayer DCA Hb A1c Waived  Result Value Ref Range   Bayer DCA Hb A1c Waived 5.3 <7.0 %      Assessment & Plan:   Problem List Items Addressed This Visit    None    Visit Diagnoses     Acute MEE (middle ear effusion), left    -  Primary   Not red. Finish amoxicillin, will treat fluid with prednisone. Call with any concerns or if not getting better.        Follow up plan: Return if symptoms worsen or fail to improve.

## 2017-11-03 ENCOUNTER — Other Ambulatory Visit: Payer: Self-pay | Admitting: Family Medicine

## 2017-11-03 ENCOUNTER — Encounter: Payer: Self-pay | Admitting: Family Medicine

## 2017-11-03 MED ORDER — CYCLOBENZAPRINE HCL 10 MG PO TABS: 10.0000 mg | ORAL_TABLET | Freq: Every day | ORAL | 0 refills | Status: DC

## 2018-03-06 ENCOUNTER — Encounter: Payer: Self-pay | Admitting: Family Medicine

## 2018-03-06 ENCOUNTER — Ambulatory Visit (INDEPENDENT_AMBULATORY_CARE_PROVIDER_SITE_OTHER): Payer: Managed Care, Other (non HMO) | Admitting: Family Medicine

## 2018-03-06 VITALS — BP 134/88 | HR 92 | Temp 98.5°F | Wt 219.2 lb

## 2018-03-06 DIAGNOSIS — Z634 Disappearance and death of family member: Secondary | ICD-10-CM | POA: Diagnosis not present

## 2018-03-06 DIAGNOSIS — Z0001 Encounter for general adult medical examination with abnormal findings: Secondary | ICD-10-CM

## 2018-03-06 DIAGNOSIS — N3281 Overactive bladder: Secondary | ICD-10-CM | POA: Diagnosis not present

## 2018-03-06 DIAGNOSIS — Z0000 Encounter for general adult medical examination without abnormal findings: Secondary | ICD-10-CM

## 2018-03-06 DIAGNOSIS — Z1239 Encounter for other screening for malignant neoplasm of breast: Secondary | ICD-10-CM

## 2018-03-06 DIAGNOSIS — F515 Nightmare disorder: Secondary | ICD-10-CM | POA: Diagnosis not present

## 2018-03-06 DIAGNOSIS — Z1231 Encounter for screening mammogram for malignant neoplasm of breast: Secondary | ICD-10-CM

## 2018-03-06 DIAGNOSIS — F4321 Adjustment disorder with depressed mood: Secondary | ICD-10-CM

## 2018-03-06 LAB — UA/M W/RFLX CULTURE, ROUTINE
Bilirubin, UA: NEGATIVE
Glucose, UA: NEGATIVE
Ketones, UA: NEGATIVE
Leukocytes, UA: NEGATIVE
Nitrite, UA: NEGATIVE
PH UA: 5 (ref 5.0–7.5)
Protein, UA: NEGATIVE
Specific Gravity, UA: 1.015 (ref 1.005–1.030)
UUROB: 0.2 mg/dL (ref 0.2–1.0)

## 2018-03-06 LAB — MICROSCOPIC EXAMINATION: BACTERIA UA: NONE SEEN

## 2018-03-06 MED ORDER — TOLTERODINE TARTRATE ER 2 MG PO CP24: 2.0000 mg | ORAL_CAPSULE | Freq: Every day | ORAL | 3 refills | Status: DC

## 2018-03-06 MED ORDER — PRAZOSIN HCL 1 MG PO CAPS: 1.0000 mg | ORAL_CAPSULE | Freq: Every day | ORAL | 3 refills | Status: DC

## 2018-03-06 MED ORDER — NAPROXEN 500 MG PO TABS: 500.0000 mg | ORAL_TABLET | Freq: Two times a day (BID) | ORAL | 1 refills | Status: DC

## 2018-03-06 NOTE — Patient Instructions (Addendum)
Your appointment for your Mammogram is scheduled for: Tuesday 03/31/2018 at 1:00PM [facility] [place], [place] [address] [phone]   Health Maintenance, Female Adopting a healthy lifestyle and getting preventive care can go a long way to promote health and wellness. Talk with your health care provider about what schedule of regular examinations is right for you. This is a good chance for you to check in with your provider about disease prevention and staying healthy. In between checkups, there are plenty of things you can do on your own. Experts have done a lot of research about which lifestyle changes and preventive measures are most likely to keep you healthy. Ask your health care provider for more information. Weight and diet Eat a healthy diet  Be sure to include plenty of vegetables, fruits, low-fat dairy products, and lean protein.  Do not eat a lot of foods high in solid fats, added sugars, or salt.  Get regular exercise. This is one of the most important things you can do for your health. ? Most adults should exercise for at least 150 minutes each week. The exercise should increase your heart rate and make you sweat (moderate-intensity exercise). ? Most adults should also do strengthening exercises at least twice a week. This is in addition to the moderate-intensity exercise.  Maintain a healthy weight  Body mass index (BMI) is a measurement that can be used to identify possible weight problems. It estimates body fat based on height and weight. Your health care provider can help determine your BMI and help you achieve or maintain a healthy weight.  For females 71 years of age and older: ? A BMI below 18.5 is considered underweight. ? A BMI of 18.5 to 24.9 is normal. ? A BMI of 25 to 29.9 is considered overweight. ? A BMI of 30 and above is considered obese.  Watch levels of cholesterol and blood lipids  You should start having your blood  tested for lipids and cholesterol at 46 years of age, then have this test every 5 years.  You may need to have your cholesterol levels checked more often if: ? Your lipid or cholesterol levels are high. ? You are older than 46 years of age. ? You are at high risk for heart disease.  Cancer screening Lung Cancer  Lung cancer screening is recommended for adults 2-49 years old who are at high risk for lung cancer because of a history of smoking.  A yearly low-dose CT scan of the lungs is recommended for people who: ? Currently smoke. ? Have quit within the past 15 years. ? Have at least a 30-pack-year history of smoking. A pack year is smoking an average of one pack of cigarettes a day for 1 year.  Yearly screening should continue until it has been 15 years since you quit.  Yearly screening should stop if you develop a health problem that would prevent you from having lung cancer treatment.  Breast Cancer  Practice breast self-awareness. This means understanding how your breasts normally appear and feel.  It also means doing regular breast self-exams. Let your health care provider know about any changes, no matter how small.  If you are in your 20s or 30s, you should have a clinical breast exam (CBE) by a health care provider every 1-3 years as part of a regular health exam.  If you are 36 or older, have a CBE every year. Also consider having a breast X-ray (mammogram) every year.  If you have a family history of breast cancer, talk to your health care provider about genetic screening.  If you are at high risk for breast cancer, talk to your health care provider about having an MRI and a mammogram every year.  Breast cancer gene (BRCA) assessment is recommended for women who have family members with BRCA-related cancers. BRCA-related cancers include: ? Breast. ? Ovarian. ? Tubal. ? Peritoneal cancers.  Results of the assessment will determine the need for genetic counseling and  BRCA1 and BRCA2 testing.  Cervical Cancer Your health care provider may recommend that you be screened regularly for cancer of the pelvic organs (ovaries, uterus, and vagina). This screening involves a pelvic examination, including checking for microscopic changes to the surface of your cervix (Pap test). You may be encouraged to have this screening done every 3 years, beginning at age 35.  For women ages 72-65, health care providers may recommend pelvic exams and Pap testing every 3 years, or they may recommend the Pap and pelvic exam, combined with testing for human papilloma virus (HPV), every 5 years. Some types of HPV increase your risk of cervical cancer. Testing for HPV may also be done on women of any age with unclear Pap test results.  Other health care providers may not recommend any screening for nonpregnant women who are considered low risk for pelvic cancer and who do not have symptoms. Ask your health care provider if a screening pelvic exam is right for you.  If you have had past treatment for cervical cancer or a condition that could lead to cancer, you need Pap tests and screening for cancer for at least 20 years after your treatment. If Pap tests have been discontinued, your risk factors (such as having a new sexual partner) need to be reassessed to determine if screening should resume. Some women have medical problems that increase the chance of getting cervical cancer. In these cases, your health care provider may recommend more frequent screening and Pap tests.  Colorectal Cancer  This type of cancer can be detected and often prevented.  Routine colorectal cancer screening usually begins at 46 years of age and continues through 46 years of age.  Your health care provider may recommend screening at an earlier age if you have risk factors for colon cancer.  Your health care provider may also recommend using home test kits to check for hidden blood in the stool.  A small camera  at the end of a tube can be used to examine your colon directly (sigmoidoscopy or colonoscopy). This is done to check for the earliest forms of colorectal cancer.  Routine screening usually begins at age 62.  Direct examination of the colon should be repeated every 5-10 years through 46 years of age. However, you may need to be screened more often if early forms of precancerous polyps or small growths are found.  Skin Cancer  Check your skin from head to toe regularly.  Tell your health care provider about any new moles or changes in moles, especially if there is a change in a mole's shape or color.  Also tell your health care provider if you have a mole that is larger than the size of a pencil eraser.  Always use sunscreen. Apply sunscreen liberally and repeatedly throughout the day.  Protect yourself by wearing long sleeves, pants, a wide-brimmed hat, and sunglasses whenever you are outside.  Heart disease, diabetes, and high blood pressure  High blood pressure causes heart disease and  the risk of stroke. High blood pressure is more likely to develop in: ? People who have blood pressure in the high end of the normal range (130-139/85-89 mm Hg). ? People who are overweight or obese. ? People who are African American.  If you are 18-39 years of age, have your blood pressure checked every 3-5 years. If you are 40 years of age or older, have your blood pressure checked every year. You should have your blood pressure measured twice-once when you are at a hospital or clinic, and once when you are not at a hospital or clinic. Record the average of the two measurements. To check your blood pressure when you are not at a hospital or clinic, you can use: ? An automated blood pressure machine at a pharmacy. ? A home blood pressure monitor.  If you are between 55 years and 79 years old, ask your health care provider if you should take aspirin to prevent strokes.  Have regular diabetes screenings. This  involves taking a blood sample to check your fasting blood sugar level. ? If you are at a normal weight and have a low risk for diabetes, have this test once every three years after 45 years of age. ? If you are overweight and have a high risk for diabetes, consider being tested at a younger age or more often. Preventing infection Hepatitis B  If you have a higher risk for hepatitis B, you should be screened for this virus. You are considered at high risk for hepatitis B if: ? You were born in a country where hepatitis B is common. Ask your health care provider which countries are considered high risk. ? Your parents were born in a high-risk country, and you have not been immunized against hepatitis B (hepatitis B vaccine). ? You have HIV or AIDS. ? You use needles to inject street drugs. ? You live with someone who has hepatitis B. ? You have had sex with someone who has hepatitis B. ? You get hemodialysis treatment. ? You take certain medicines for conditions, including cancer, organ transplantation, and autoimmune conditions.  Hepatitis C  Blood testing is recommended for: ? Everyone born from 1945 through 1965. ? Anyone with known risk factors for hepatitis C.  Sexually transmitted infections (STIs)  You should be screened for sexually transmitted infections (STIs) including gonorrhea and chlamydia if: ? You are sexually active and are younger than 46 years of age. ? You are older than 46 years of age and your health care provider tells you that you are at risk for this type of infection. ? Your sexual activity has changed since you were last screened and you are at an increased risk for chlamydia or gonorrhea. Ask your health care provider if you are at risk.  If you do not have HIV, but are at risk, it may be recommended that you take a prescription medicine daily to prevent HIV infection. This is called pre-exposure prophylaxis (PrEP). You are considered at risk if: ? You are  sexually active and do not regularly use condoms or know the HIV status of your partner(s). ? You take drugs by injection. ? You are sexually active with a partner who has HIV.  Talk with your health care provider about whether you are at high risk of being infected with HIV. If you choose to begin PrEP, you should first be tested for HIV. You should then be tested every 3 months for as long as you are taking PrEP.   Pregnancy  If you are premenopausal and you may become pregnant, ask your health care provider about preconception counseling.  If you may become pregnant, take 400 to 800 micrograms (mcg) of folic acid every day.  If you want to prevent pregnancy, talk to your health care provider about birth control (contraception). Osteoporosis and menopause  Osteoporosis is a disease in which the bones lose minerals and strength with aging. This can result in serious bone fractures. Your risk for osteoporosis can be identified using a bone density scan.  If you are 65 years of age or older, or if you are at risk for osteoporosis and fractures, ask your health care provider if you should be screened.  Ask your health care provider whether you should take a calcium or vitamin D supplement to lower your risk for osteoporosis.  Menopause may have certain physical symptoms and risks.  Hormone replacement therapy may reduce some of these symptoms and risks. Talk to your health care provider about whether hormone replacement therapy is right for you. Follow these instructions at home:  Schedule regular health, dental, and eye exams.  Stay current with your immunizations.  Do not use any tobacco products including cigarettes, chewing tobacco, or electronic cigarettes.  If you are pregnant, do not drink alcohol.  If you are breastfeeding, limit how much and how often you drink alcohol.  Limit alcohol intake to no more than 1 drink per day for nonpregnant women. One drink equals 12 ounces of  beer, 5 ounces of wine, or 1 ounces of hard liquor.  Do not use street drugs.  Do not share needles.  Ask your health care provider for help if you need support or information about quitting drugs.  Tell your health care provider if you often feel depressed.  Tell your health care provider if you have ever been abused or do not feel safe at home. This information is not intended to replace advice given to you by your health care provider. Make sure you discuss any questions you have with your health care provider. Document Released: 03/25/2011 Document Revised: 02/15/2016 Document Reviewed: 06/13/2015 Elsevier Interactive Patient Education  2018 [employer]vier Inc[employer]  

## 2018-03-06 NOTE — Assessment & Plan Note (Signed)
Will start her on detrol and recheck 1 month. Call with any concerns.

## 2018-03-06 NOTE — Assessment & Plan Note (Signed)
Doing better. Taking klonopin occasionally. Call with any concerns. Continue to monitor.

## 2018-03-06 NOTE — Progress Notes (Signed)
BP 134/88 (BP Location: Left Arm, Patient Position: Sitting, Cuff Size: Large)   Pulse 92   Temp 98.5 F (36.9 C)   Wt 219 lb 4 oz (99.5 kg)   SpO2 97%   BMI 37.05 kg/m    Subjective:    Patient ID: Heidi Taylor, female    DOB: September 02, 1971, 47 y.o.   MRN: [id number]  HPI: Heidi Taylor is a 47 y.o. female presenting on 03/06/2018 for comprehensive medical examination. Current medical complaints include:  URINARY SYMPTOMS- Just peeing when she's coughing or sneezing.  Duration: 13 years Dysuria: burning Urinary frequency: yes Urgency: yes Small volume voids: yes Symptom severity: moderate Urinary incontinence: yes Foul odor: no Hematuria: no Abdominal pain: no Back pain: no Suprapubic pain/pressure: no Flank pain: no Fever:  no Vomiting: no Relief with cranberry juice: no Relief with pyridium: no Status: worse Previous urinary tract infection: no Recurrent urinary tract infection: no Sexual activity: monogomous History of sexually transmitted disease: no Vaginal discharge: no Treatments attempted: decreasing fluids   Has been taking klonopin very occasionally. Feeling better. Having nightmares.  She currently lives with: husband Menopausal Symptoms: no  Depression Screen done today and results listed below:  Depression screen [facility] 2/9 03/06/2018 09/02/2017 06/03/2017 03/03/2017 01/27/2017  Decreased Interest 1 1 1 1 3   Down, Depressed, Hopeless 1 1 1 1 3   PHQ - 2 Score 2 2 2 2 6   Altered sleeping 1 1 - - 3  Tired, decreased energy 1 1 - - 3  Change in appetite 1 1 - - 3  Feeling bad or failure about yourself  0 0 - - 2  Trouble concentrating 0 0 - - 3  Moving slowly or fidgety/restless 0 0 - - 3  Suicidal thoughts 0 0 - - 0  PHQ-9 Score 5 5 - - 23  Difficult doing work/chores Somewhat difficult Not difficult at all - Somewhat difficult -     Past Medical History:  Past Medical History:  Diagnosis Date  . Anxiety   . DDD (degenerative disc disease),  lumbar     Surgical History:  Past Surgical History:  Procedure Laterality Date  . HERNIA REPAIR      Medications:  Current Outpatient Medications on File Prior to Visit  Medication Sig  . clonazePAM (KLONOPIN) 0.5 MG tablet Take 0.5-1 tablets (0.25-0.5 mg total) by mouth 2 (two) times daily as needed for anxiety.  . cyclobenzaprine (FLEXERIL) 10 MG tablet Take 1 tablet (10 mg total) by mouth at bedtime.   No current facility-administered medications on file prior to visit.     Allergies:  Allergies  Allergen Reactions  . Duloxetine Other (See Comments)    GI and weight  . Sertraline Other (See Comments)    GI and weight  . Trintellix [Vortioxetine] Nausea And Vomiting    Social History:  Social History   Socioeconomic History  . Marital status: Married    Spouse name: Not on file  . Number of children: Not on file  . Years of education: Not on file  . Highest education level: Not on file  Occupational History  . Not on file  Social Needs  . Financial resource strain: Not on file  . Food insecurity:    Worry: Not on file    Inability: Not on file  . Transportation needs:    Medical: Not on file    Non-medical: Not on file  Tobacco Use  . Smoking status: Former Smoker    Last  attempt to quit: 09/06/2009    Years since quitting: 8.5  . Smokeless tobacco: Never Used  Substance and Sexual Activity  . Alcohol use: Yes    Comment: On occasion  . Drug use: No  . Sexual activity: Yes    Birth control/protection: None  Lifestyle  . Physical activity:    Days per week: Not on file    Minutes per session: Not on file  . Stress: Not on file  Relationships  . Social connections:    Talks on phone: Not on file    Gets together: Not on file    Attends religious service: Not on file    Active member of club or organization: Not on file    Attends meetings of clubs or organizations: Not on file    Relationship status: Not on file  . Intimate partner violence:     Fear of current or ex partner: Not on file    Emotionally abused: Not on file    Physically abused: Not on file    Forced sexual activity: Not on file  Other Topics Concern  . Not on file  Social History Narrative  . Not on file   Social History   Tobacco Use  Smoking Status Former Smoker  . Last attempt to quit: 09/06/2009  . Years since quitting: 8.5  Smokeless Tobacco Never Used   Social History   Substance and Sexual Activity  Alcohol Use Yes   Comment: On occasion    Family History:  Family History  Problem Relation Age of Onset  . Cancer Mother        Brain  . Parkinson's disease Father   . Parkinson's disease Paternal Grandfather     Past medical history, surgical history, medications, allergies, family history and social history reviewed with patient today and changes made to appropriate areas of the chart.   Review of Systems  Constitutional: Negative.   HENT: Negative.   Eyes: Negative.   Respiratory: Negative.   Cardiovascular: Negative.   Gastrointestinal: Positive for heartburn. Negative for abdominal pain, blood in stool, constipation, diarrhea, melena, nausea and vomiting.  Genitourinary: Negative.   Musculoskeletal: Positive for joint pain and myalgias. Negative for back pain, falls and neck pain.  Skin: Negative.   Neurological: Negative.   Endo/Heme/Allergies: Positive for environmental allergies. Negative for polydipsia. Does not bruise/bleed easily.  Psychiatric/Behavioral: Negative for depression, hallucinations, memory loss, substance abuse and suicidal ideas. The patient has insomnia. The patient is not nervous/anxious.     All other ROS negative except what is listed above and in the HPI.      Objective:    BP 134/88 (BP Location: Left Arm, Patient Position: Sitting, Cuff Size: Large)   Pulse 92   Temp 98.5 F (36.9 C)   Wt 219 lb 4 oz (99.5 kg)   SpO2 97%   BMI 37.05 kg/m   Wt Readings from Last 3 Encounters:  03/06/18 219 lb 4  oz (99.5 kg)  10/01/17 212 lb 1.6 oz (96.2 kg)  09/02/17 215 lb 5 oz (97.7 kg)    Physical Exam  Constitutional: She is oriented to person, place, and time. She appears well-developed and well-nourished. No distress.  HENT:  Head: Normocephalic and atraumatic.  Right Ear: Hearing, tympanic membrane, external ear and ear canal normal.  Left Ear: Hearing, tympanic membrane, external ear and ear canal normal.  Nose: Nose normal.  Mouth/Throat: Uvula is midline, oropharynx is clear and moist and mucous membranes are normal. No  oropharyngeal exudate.  Eyes: Pupils are equal, round, and reactive to light. Conjunctivae, EOM and lids are normal. Right eye exhibits no discharge. Left eye exhibits no discharge. No scleral icterus.  Neck: Normal range of motion. Neck supple. No JVD present. No tracheal deviation present. No thyromegaly present.  Cardiovascular: Normal rate, regular rhythm, normal heart sounds and intact distal pulses. Exam reveals no gallop and no friction rub.  No murmur heard. Pulmonary/Chest: Effort normal and breath sounds normal. No stridor. No respiratory distress. She has no wheezes. She has no rales. She exhibits no tenderness. Right breast exhibits no inverted nipple, no mass, no nipple discharge, no skin change and no tenderness. Left breast exhibits no inverted nipple, no mass, no nipple discharge, no skin change and no tenderness. No breast swelling, tenderness, discharge or bleeding. Breasts are symmetrical.  Abdominal: Soft. Bowel sounds are normal. She exhibits no distension and no mass. There is no tenderness. There is no rebound and no guarding. No hernia.  Genitourinary:  Genitourinary Comments: Pelvic exam deferred with shared decision making  Musculoskeletal: Normal range of motion. She exhibits no edema, tenderness or deformity.  Lymphadenopathy:    She has no cervical adenopathy.  Neurological: She is alert and oriented to person, place, and time. She displays  normal reflexes. No cranial nerve deficit or sensory deficit. She exhibits normal muscle tone. Coordination normal.  Skin: Skin is warm, dry and intact. Capillary refill takes less than 2 seconds. No rash noted. She is not diaphoretic. No erythema. No pallor.  Psychiatric: She has a normal mood and affect. Her speech is normal and behavior is normal. Judgment and thought content normal. Cognition and memory are normal.  Nursing note and vitals reviewed.   Results for orders placed or performed in visit on 05/09/17  Bayer DCA Hb A1c Waived  Result Value Ref Range   HB A1C (BAYER DCA - WAIVED) 5.3 <7.0 %      Assessment & Plan:   Problem List Items Addressed This Visit      Nervous and Auditory   Nightmares    Will start prazosin and recheck 1 month. Call with any concerns.         Genitourinary   Overactive bladder    Will start her on detrol and recheck 1 month. Call with any concerns.         Other   Grief at loss of child    Doing better. Taking klonopin occasionally. Call with any concerns. Continue to monitor.        Other Visit Diagnoses    Routine general medical examination at a health care facility    -  Primary   Vaccines up to date. Screening labs checked today. Pap up to date. Mammogram ordered. Call with any concerns. Continue to monitor.    Relevant Orders   CBC with Differential/Platelet   Comprehensive metabolic panel   Lipid Panel w/o Chol/HDL Ratio   TSH   UA/M w/rflx Culture, Routine   Measles/Mumps/Rubella Immunity   Screening for breast cancer       Mammogram set up.    Relevant Orders   MM 3D SCREEN BREAST BILATERAL       Follow up plan: Return in about 1 month (around 04/03/2018) for follow up bladder and nightmares.   LABORATORY TESTING:  - Pap smear: up to date  IMMUNIZATIONS:   - Tdap: Tetanus vaccination status reviewed: last tetanus booster within 10 years. - Influenza: Postponed to flu season - Pneumovax:  Refused  SCREENING: -Mammogram: Ordered today   PATIENT COUNSELING:   Advised to take 1 mg of folate supplement per day if capable of pregnancy.   Sexuality: Discussed sexually transmitted diseases, partner selection, use of condoms, avoidance of unintended pregnancy  and contraceptive alternatives.   Advised to avoid cigarette smoking.  I discussed with the patient that most people either abstain from alcohol or drink within safe limits (<=14/week and <=4 drinks/occasion for males, <=7/weeks and <= 3 drinks/occasion for females) and that the risk for alcohol disorders and other health effects rises proportionally with the number of drinks per week and how often a drinker exceeds daily limits.  Discussed cessation/primary prevention of drug use and availability of treatment for abuse.   Diet: Encouraged to adjust caloric intake to maintain  or achieve ideal body weight, to reduce intake of dietary saturated fat and total fat, to limit sodium intake by avoiding high sodium foods and not adding table salt, and to maintain adequate dietary potassium and calcium preferably from fresh fruits, vegetables, and low-fat dairy products.    stressed the importance of regular exercise  Injury prevention: Discussed safety belts, safety helmets, smoke detector, smoking near bedding or upholstery.   Dental health: Discussed importance of regular tooth brushing, flossing, and dental visits.    NEXT PREVENTATIVE PHYSICAL DUE IN 1 YEAR. Return in about 1 month (around 04/03/2018) for follow up bladder and nightmares.

## 2018-03-06 NOTE — Assessment & Plan Note (Signed)
Will start prazosin and recheck 1 month. Call with any concerns.

## 2018-03-07 LAB — CBC WITH DIFFERENTIAL/PLATELET
Basophils Absolute: 0.1 10*3/uL (ref 0.0–0.2)
Basos: 1 %
EOS (ABSOLUTE): 0.1 10*3/uL (ref 0.0–0.4)
EOS: 1 %
HEMATOCRIT: 40 % (ref 34.0–46.6)
Hemoglobin: 12.7 g/dL (ref 11.1–15.9)
IMMATURE GRANULOCYTES: 0 %
Immature Grans (Abs): 0 10*3/uL (ref 0.0–0.1)
Lymphocytes Absolute: 2.8 10*3/uL (ref 0.7–3.1)
Lymphs: 23 %
MCH: 25.6 pg — ABNORMAL LOW (ref 26.6–33.0)
MCHC: 31.8 g/dL (ref 31.5–35.7)
MCV: 81 fL (ref 79–97)
MONOS ABS: 0.9 10*3/uL (ref 0.1–0.9)
Monocytes: 7 %
NEUTROS PCT: 68 %
Neutrophils Absolute: 8.1 10*3/uL — ABNORMAL HIGH (ref 1.4–7.0)
PLATELETS: 494 10*3/uL — AB (ref 150–450)
RBC: 4.96 x10E6/uL (ref 3.77–5.28)
RDW: 15.3 % (ref 12.3–15.4)
WBC: 11.9 10*3/uL — AB (ref 3.4–10.8)

## 2018-03-07 LAB — COMPREHENSIVE METABOLIC PANEL
ALT: 19 IU/L (ref 0–32)
AST: 17 IU/L (ref 0–40)
Albumin/Globulin Ratio: 1.4 (ref 1.2–2.2)
Albumin: 4.2 g/dL (ref 3.5–5.5)
Alkaline Phosphatase: 75 IU/L (ref 39–117)
BUN/Creatinine Ratio: 14 (ref 9–23)
BUN: 12 mg/dL (ref 6–24)
Bilirubin Total: 0.5 mg/dL (ref 0.0–1.2)
CALCIUM: 9.4 mg/dL (ref 8.7–10.2)
CO2: 22 mmol/L (ref 20–29)
CREATININE: 0.87 mg/dL (ref 0.57–1.00)
Chloride: 102 mmol/L (ref 96–106)
GFR calc Af Amer: 92 mL/min/{1.73_m2} (ref >59)
GFR, EST NON AFRICAN AMERICAN: 80 mL/min/{1.73_m2} (ref >59)
GLOBULIN, TOTAL: 3 g/dL (ref 1.5–4.5)
Glucose: 100 mg/dL — ABNORMAL HIGH (ref 65–99)
Potassium: 4.4 mmol/L (ref 3.5–5.2)
Sodium: 137 mmol/L (ref 134–144)
Total Protein: 7.2 g/dL (ref 6.0–8.5)

## 2018-03-07 LAB — TSH: TSH: 2.22 u[IU]/mL (ref 0.450–4.500)

## 2018-03-07 LAB — MEASLES/MUMPS/RUBELLA IMMUNITY
MUMPS ABS, IGG: <9 AU/mL — ABNORMAL LOW (ref >10.9)
RUBEOLA AB, IGG: 103 AU/mL (ref >29.9)
Rubella Antibodies, IGG: 7.02 index (ref >0.99)

## 2018-03-07 LAB — LIPID PANEL W/O CHOL/HDL RATIO
Cholesterol, Total: 198 mg/dL (ref 100–199)
HDL: 57 mg/dL (ref >39)
LDL Calculated: 126 mg/dL — ABNORMAL HIGH (ref 0–99)
TRIGLYCERIDES: 77 mg/dL (ref 0–149)
VLDL CHOLESTEROL CAL: 15 mg/dL (ref 5–40)

## 2018-03-09 ENCOUNTER — Telehealth: Payer: Self-pay | Admitting: Family Medicine

## 2018-03-09 DIAGNOSIS — Z283 Underimmunization status: Secondary | ICD-10-CM

## 2018-03-09 DIAGNOSIS — Z2839 Other underimmunization status: Secondary | ICD-10-CM

## 2018-03-09 NOTE — Telephone Encounter (Signed)
Patient notified. [name] will add on an A1C

## 2018-03-09 NOTE — Telephone Encounter (Signed)
Please let Heidi Taylor know that Heidi Taylor labs were normal except she is not immune to mumps. She can come in at any time to get Heidi Taylor booster MMR vaccine. Order in.   I also didn't order an A1c on Heidi Taylor and she apparently needed one- can we see if [name] can add one on? Thanks!

## 2018-03-29 ENCOUNTER — Other Ambulatory Visit: Payer: Self-pay | Admitting: Family Medicine

## 2018-03-30 MED ORDER — CLONAZEPAM 0.5 MG PO TABS: 0.2500 mg | ORAL_TABLET | Freq: Two times a day (BID) | ORAL | 0 refills | Status: DC | PRN

## 2018-03-31 ENCOUNTER — Ambulatory Visit
Admission: RE | Admit: 2018-03-31 | Discharge: 2018-03-31 | Disposition: A | Discharge status: Home / Self Care | Payer: Managed Care, Other (non HMO) | Source: Ambulatory Visit | Attending: Family Medicine | Admitting: Family Medicine

## 2018-03-31 DIAGNOSIS — Z1239 Encounter for other screening for malignant neoplasm of breast: Secondary | ICD-10-CM

## 2018-03-31 DIAGNOSIS — Z1231 Encounter for screening mammogram for malignant neoplasm of breast: Secondary | ICD-10-CM | POA: Diagnosis not present

## 2018-03-31 LAB — HGB A1C W/O EAG: Hgb A1c MFr Bld: 5.7 % — ABNORMAL HIGH (ref 4.8–5.6)

## 2018-03-31 LAB — SPECIMEN STATUS REPORT

## 2018-03-31 IMAGING — MG MM DIGITAL SCREENING BILAT W/ TOMO W/ CAD
6 of 10 series · 6 of 30 positions shown · non-contrast
Comparison: None.

CLINICAL DATA: Screening.

EXAM:
DIGITAL SCREENING BILATERAL MAMMOGRAM WITH TOMO AND CAD

[L MLO synth-2D]
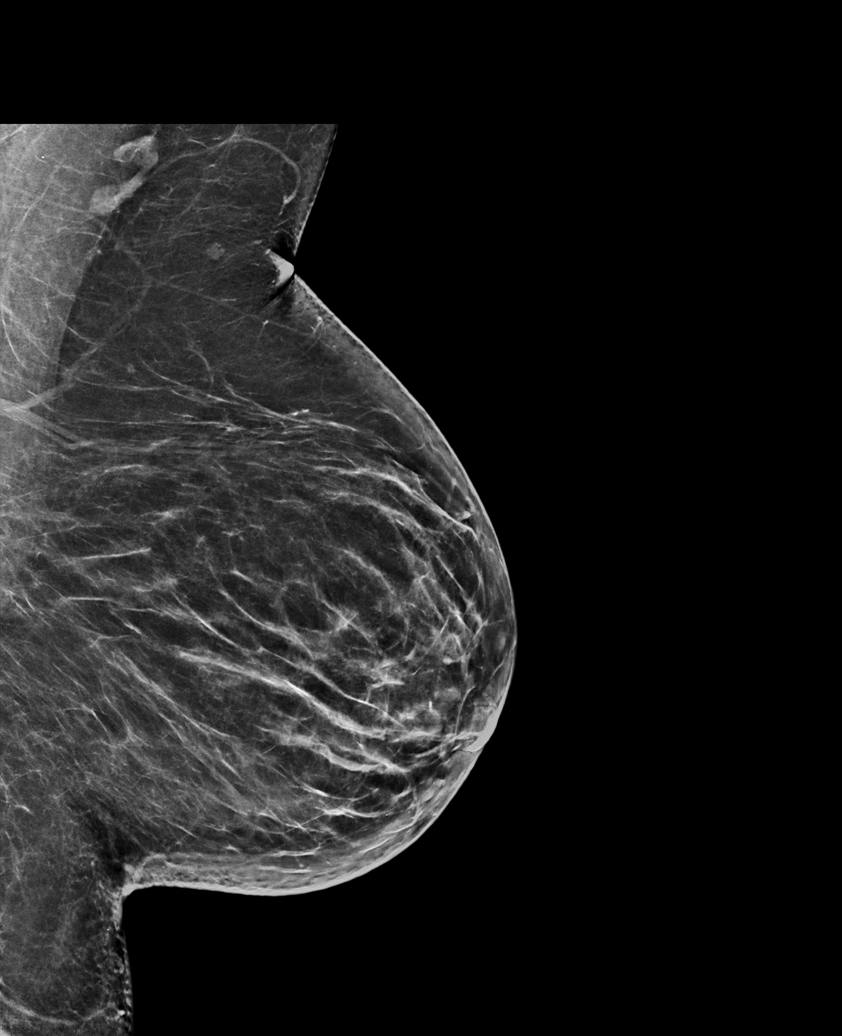

[L CC synth-2D]
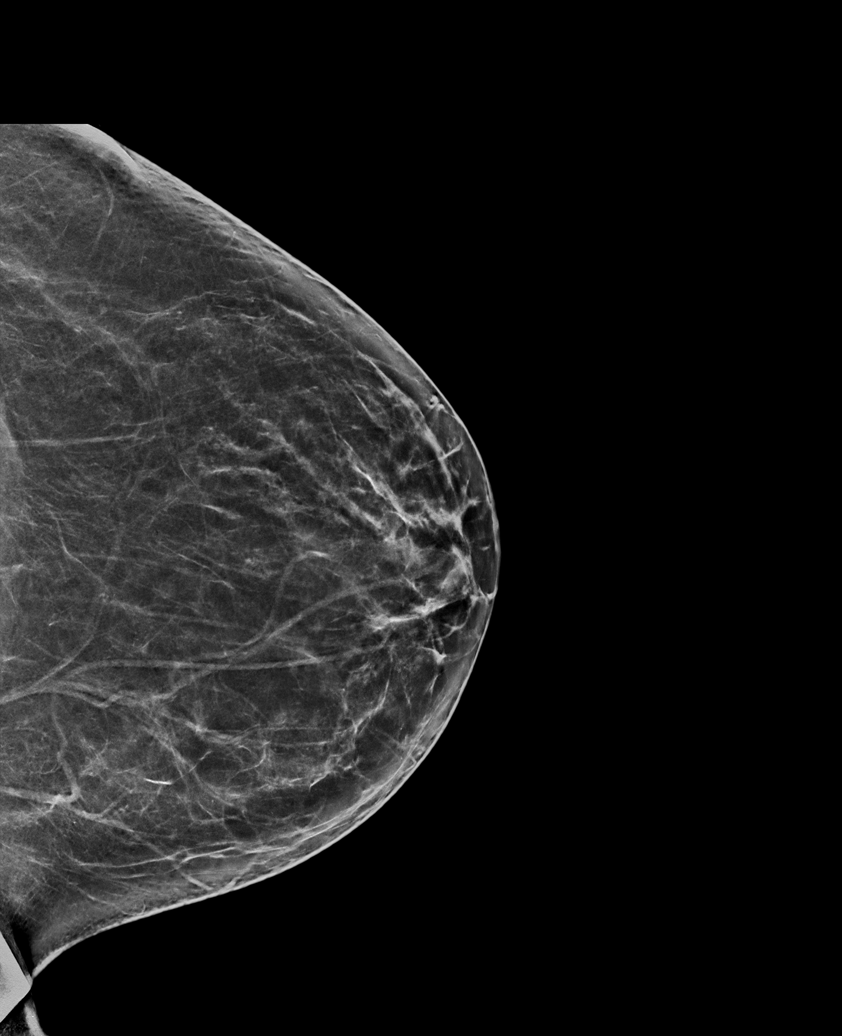

[R CC synth-2D]
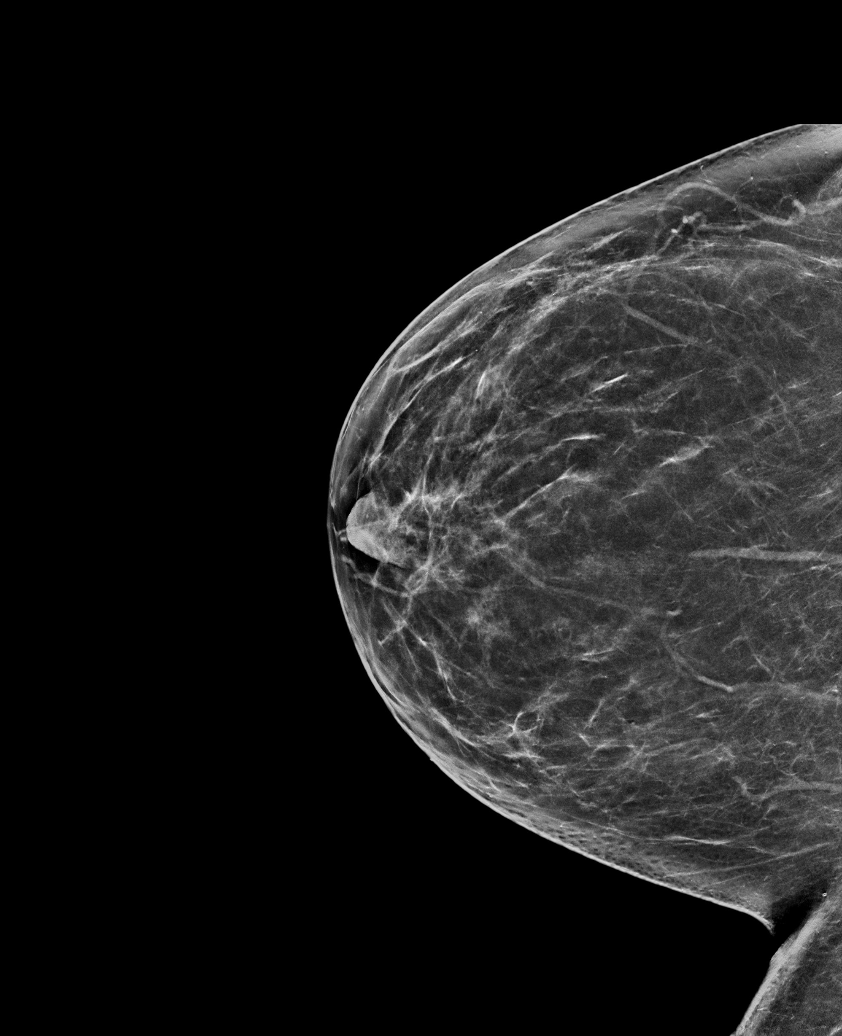

[R MLO synth-2D]
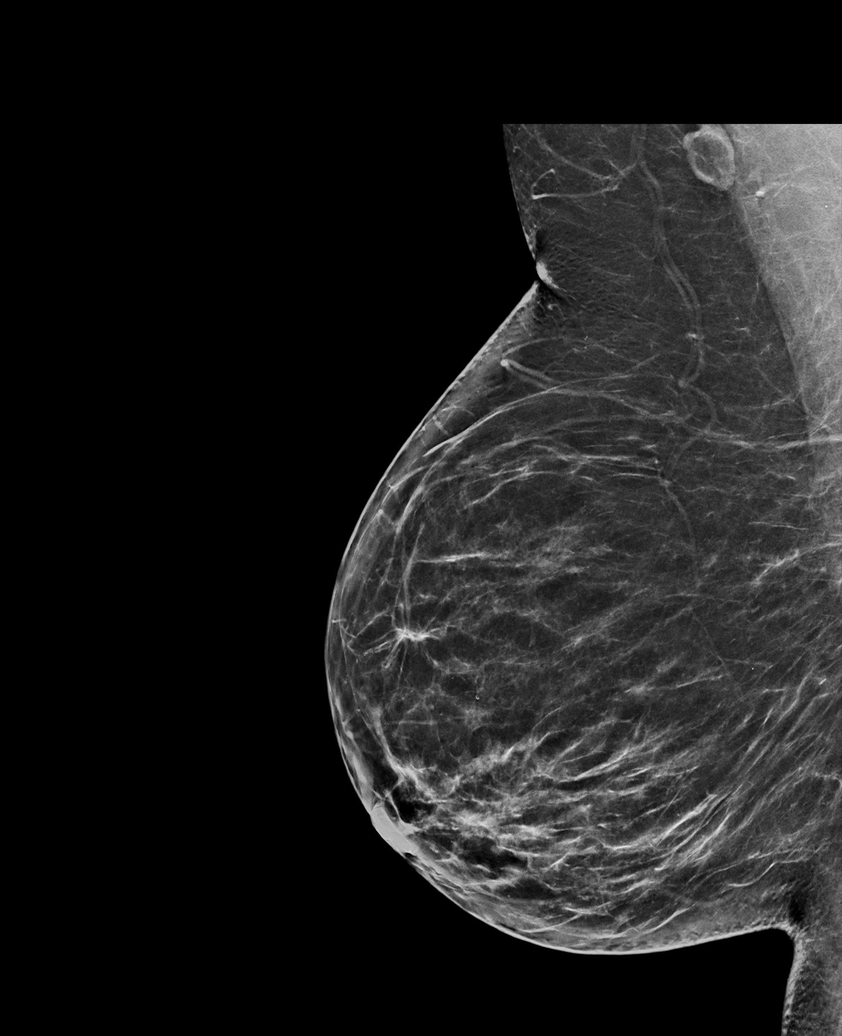

[R XCCL synth-2D]
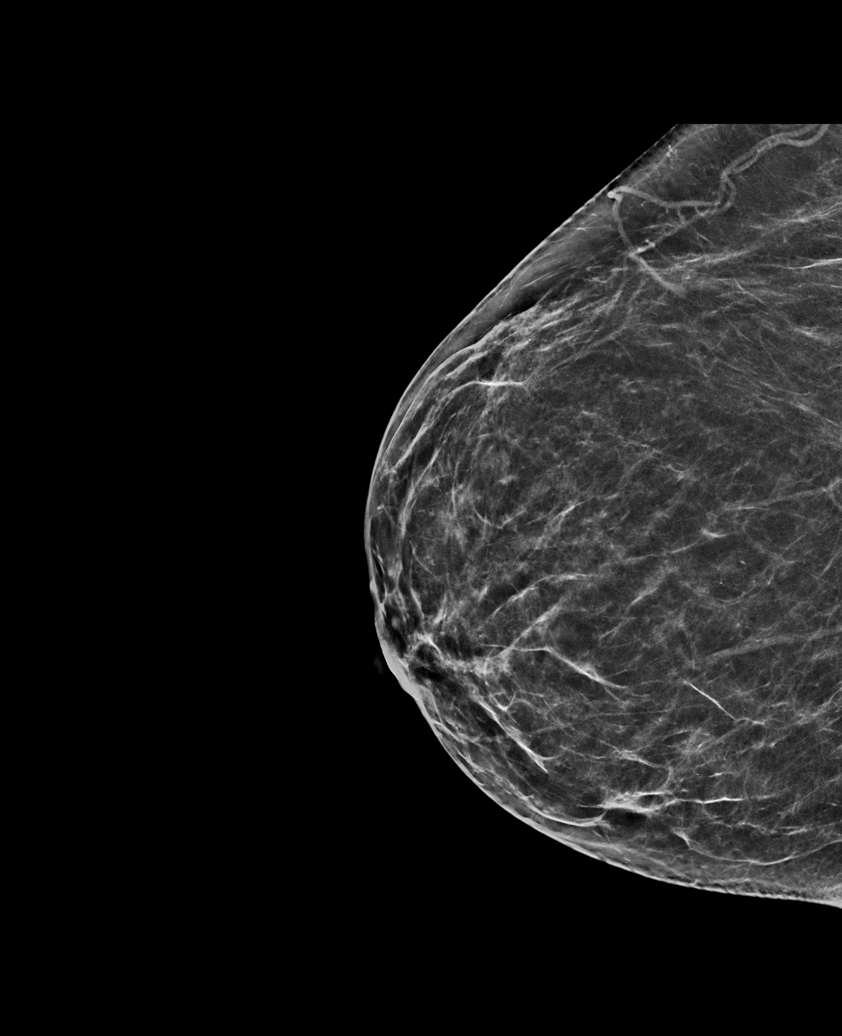

[L MLO tomo · tomo slice 38/75.0]
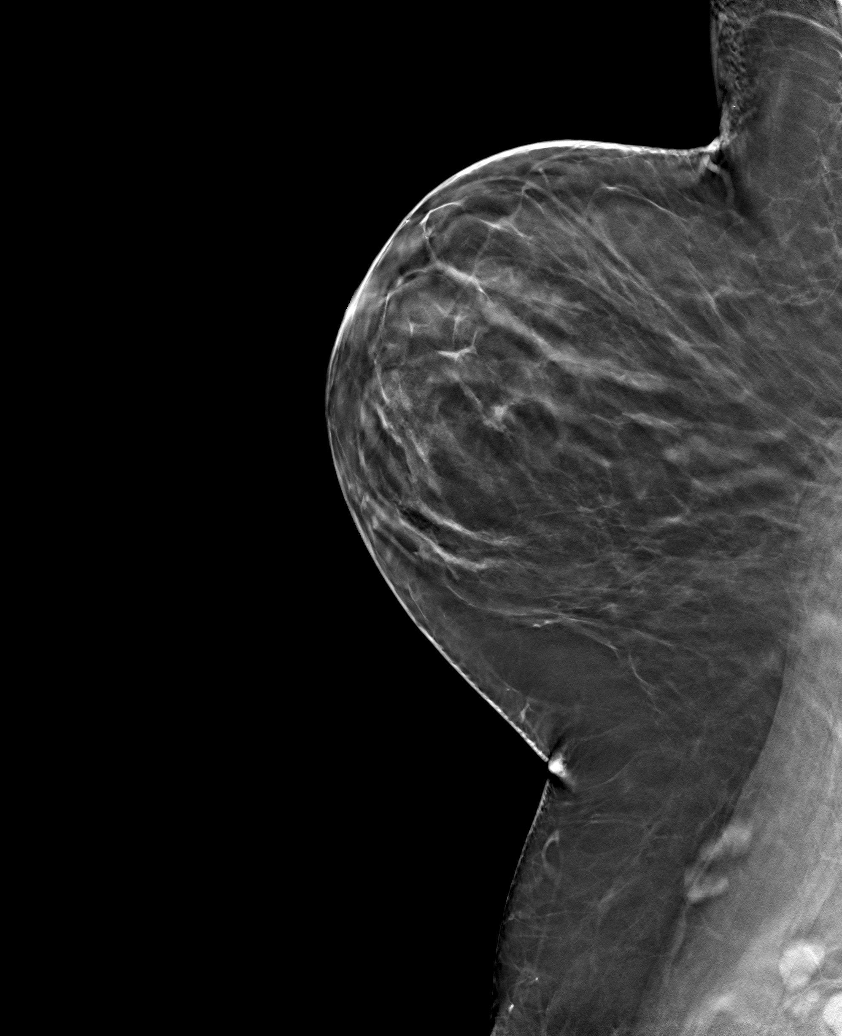

[6 of 30 positions shown; findings below may reference images not displayed]

ACR Breast Density Category b: There are scattered areas of
fibroglandular density.
FINDINGS: There are no findings suspicious for malignancy. Images were
processed with CAD.
IMPRESSION: No mammographic evidence of malignancy. A result letter of this
screening mammogram will be mailed directly to the patient.

RECOMMENDATION:
Screening mammogram in one year. (Code:[1T])

BI-RADS CATEGORY  1: Negative.

## 2018-04-01 ENCOUNTER — Other Ambulatory Visit: Payer: Self-pay | Admitting: Family Medicine

## 2018-04-02 ENCOUNTER — Encounter: Payer: Self-pay | Admitting: Family Medicine

## 2018-04-02 ENCOUNTER — Other Ambulatory Visit: Payer: Self-pay | Admitting: Family Medicine

## 2018-04-02 MED ORDER — CLONAZEPAM 0.5 MG PO TABS: 0.2500 mg | ORAL_TABLET | Freq: Two times a day (BID) | ORAL | 0 refills | Status: DC | PRN

## 2018-04-09 ENCOUNTER — Encounter: Payer: Self-pay | Admitting: Family Medicine

## 2018-04-14 ENCOUNTER — Ambulatory Visit (INDEPENDENT_AMBULATORY_CARE_PROVIDER_SITE_OTHER): Payer: Managed Care, Other (non HMO) | Admitting: Family Medicine

## 2018-04-14 ENCOUNTER — Encounter: Payer: Self-pay | Admitting: Family Medicine

## 2018-04-14 VITALS — BP 138/85 | HR 88 | Temp 98.3°F | Wt 212.2 lb

## 2018-04-14 DIAGNOSIS — N3281 Overactive bladder: Secondary | ICD-10-CM | POA: Diagnosis not present

## 2018-04-14 DIAGNOSIS — N921 Excessive and frequent menstruation with irregular cycle: Secondary | ICD-10-CM

## 2018-04-14 DIAGNOSIS — F515 Nightmare disorder: Secondary | ICD-10-CM

## 2018-04-14 MED ORDER — PRAZOSIN HCL 1 MG PO CAPS: 1.0000 mg | ORAL_CAPSULE | Freq: Every day | ORAL | 1 refills | Status: DC

## 2018-04-14 MED ORDER — TOLTERODINE TARTRATE ER 2 MG PO CP24: 2.0000 mg | ORAL_CAPSULE | Freq: Every day | ORAL | 1 refills | Status: DC

## 2018-04-14 NOTE — Progress Notes (Signed)
BP 138/85 (BP Location: Left Arm, Patient Position: Sitting, Cuff Size: Normal)   Pulse 88   Temp 98.3 F (36.8 C)   Wt 212 lb 3 oz (96.2 kg)   SpO2 96%   BMI 35.86 kg/m    Subjective:    Patient ID: Heidi Taylor, female    DOB: [birth year], 47 y.o.   MRN: [id number]  HPI: Heidi Taylor is a 47 y.o. female  Chief Complaint  Patient presents with  . overactive bladder  . nightmares   No more nightmares. No side effects. Doing well with the prazosin. Very happy about it.   Still getting up to pee about every hour to try to decrease her need to pee. Feeling like her medicine is working. Not having spasms any more. Feeling well with it and happy with current treatment. No side effects.   Has been bleeding very heavily and periods are becoming more irregularly. She is not wanting to have another pregnancy and is interested in discussing ablation with GYN. She is otherwise doing well with no other concerns or complaints at this time.    Relevant past medical, surgical, family and social history reviewed and updated as indicated. Interim medical history since our last visit reviewed. Allergies and medications reviewed and updated.  Review of Systems  Constitutional: Negative.   Respiratory: Negative.   Cardiovascular: Negative.   Psychiatric/Behavioral: Negative.     Per HPI unless specifically indicated above     Objective:    BP 138/85 (BP Location: Left Arm, Patient Position: Sitting, Cuff Size: Normal)   Pulse 88   Temp 98.3 F (36.8 C)   Wt 212 lb 3 oz (96.2 kg)   SpO2 96%   BMI 35.86 kg/m   Wt Readings from Last 3 Encounters:  04/14/18 212 lb 3 oz (96.2 kg)  03/06/18 219 lb 4 oz (99.5 kg)  10/01/17 212 lb 1.6 oz (96.2 kg)    Physical Exam  Constitutional: She is oriented to person, place, and time. She appears well-developed and well-nourished. No distress.  HENT:  Head: Normocephalic and atraumatic.  Right Ear: Hearing normal.  Left Ear: Hearing  normal.  Nose: Nose normal.  Eyes: Conjunctivae and lids are normal. Right eye exhibits no discharge. Left eye exhibits no discharge. No scleral icterus.  Cardiovascular: Normal rate, regular rhythm, normal heart sounds and intact distal pulses. Exam reveals no gallop and no friction rub.  No murmur heard. Pulmonary/Chest: Effort normal and breath sounds normal. No stridor. No respiratory distress. She has no wheezes. She has no rales. She exhibits no tenderness.  Musculoskeletal: Normal range of motion.  Neurological: She is alert and oriented to person, place, and time.  Skin: Skin is warm, dry and intact. Capillary refill takes less than 2 seconds. No rash noted. She is not diaphoretic. No erythema. No pallor.  Psychiatric: She has a normal mood and affect. Her speech is normal and behavior is normal. Judgment and thought content normal. Cognition and memory are normal.  Nursing note and vitals reviewed.   Results for orders placed or performed in visit on 03/06/18  Microscopic Examination  Result Value Ref Range   WBC, UA 0-5 0 - 5 /hpf   RBC, UA 0-2 0 - 2 /hpf   Epithelial Cells (non renal) 0-10 0 - 10 /hpf   Bacteria, UA None seen None seen/Few  CBC with Differential/Platelet  Result Value Ref Range   WBC 11.9 (H) 3.4 - 10.8 x10E3/uL   RBC 4.96 3.77 -  5.28 x10E6/uL   Hemoglobin 12.7 11.1 - 15.9 g/dL   Hematocrit 40.0 34.0 - 46.6 %   MCV 81 79 - 97 fL   MCH 25.6 (L) 26.6 - 33.0 pg   MCHC 31.8 31.5 - 35.7 g/dL   RDW 15.3 12.3 - 15.4 %   Platelets 494 (H) 150 - 450 x10E3/uL   Neutrophils 68 Not Estab. %   Lymphs 23 Not Estab. %   Monocytes 7 Not Estab. %   Eos 1 Not Estab. %   Basos 1 Not Estab. %   Neutrophils Absolute 8.1 (H) 1.4 - 7.0 x10E3/uL   Lymphocytes Absolute 2.8 0.7 - 3.1 x10E3/uL   Monocytes Absolute 0.9 0.1 - 0.9 x10E3/uL   EOS (ABSOLUTE) 0.1 0.0 - 0.4 x10E3/uL   Basophils Absolute 0.1 0.0 - 0.2 x10E3/uL   Immature Granulocytes 0 Not Estab. %   Immature Grans  (Abs) 0.0 0.0 - 0.1 x10E3/uL  Comprehensive metabolic panel  Result Value Ref Range   Glucose 100 (H) 65 - 99 mg/dL   BUN 12 6 - 24 mg/dL   Creatinine, Ser 0.87 0.57 - 1.00 mg/dL   GFR calc non Af Amer 80 >59 mL/min/1.73   GFR calc Af Amer 92 >59 mL/min/1.73   BUN/Creatinine Ratio 14 9 - 23   Sodium 137 134 - 144 mmol/L   Potassium 4.4 3.5 - 5.2 mmol/L   Chloride 102 96 - 106 mmol/L   CO2 22 20 - 29 mmol/L   Calcium 9.4 8.7 - 10.2 mg/dL   Total Protein 7.2 6.0 - 8.5 g/dL   Albumin 4.2 3.5 - 5.5 g/dL   Globulin, Total 3.0 1.5 - 4.5 g/dL   Albumin/Globulin Ratio 1.4 1.2 - 2.2   Bilirubin Total 0.5 0.0 - 1.2 mg/dL   Alkaline Phosphatase 75 39 - 117 IU/L   AST 17 0 - 40 IU/L   ALT 19 0 - 32 IU/L  Lipid Panel w/o Chol/HDL Ratio  Result Value Ref Range   Cholesterol, Total 198 100 - 199 mg/dL   Triglycerides 77 0 - 149 mg/dL   HDL 57 >39 mg/dL   VLDL Cholesterol Cal 15 5 - 40 mg/dL   LDL Calculated 126 (H) 0 - 99 mg/dL  TSH  Result Value Ref Range   TSH 2.220 0.450 - 4.500 uIU/mL  UA/M w/rflx Culture, Routine  Result Value Ref Range   Specific Gravity, UA 1.015 1.005 - 1.030   pH, UA 5.0 5.0 - 7.5   Color, UA Yellow Yellow   Appearance Ur Clear Clear   Leukocytes, UA Negative Negative   Protein, UA Negative Negative/Trace   Glucose, UA Negative Negative   Ketones, UA Negative Negative   RBC, UA Trace (A) Negative   Bilirubin, UA Negative Negative   Urobilinogen, Ur 0.2 0.2 - 1.0 mg/dL   Nitrite, UA Negative Negative   Microscopic Examination See below:   Measles/Mumps/Rubella Immunity  Result Value Ref Range   Rubella Antibodies, IGG 7.02 Immune >0.99 index   RUBEOLA AB, IGG 103.0 Immune >29.9 AU/mL   MUMPS ABS, IGG <9.0 (L) Immune >10.9 AU/mL  Hgb A1c w/o eAG  Result Value Ref Range   Hgb A1c MFr Bld 5.7 (H) 4.8 - 5.6 %  Specimen status report  Result Value Ref Range   specimen status report Comment       Assessment & Plan:   Problem List Items Addressed This  Visit      Nervous and Auditory   Nightmares -  Primary    Under good control on the prazosin. Continue current regimen. Refills given. Call with any concerns.       Relevant Orders   Ambulatory referral to Obstetrics / Gynecology     Genitourinary   Overactive bladder    Under good control on the detrol. Will continue to space out timed voidings and will call if needs to increase her mediation. Continue current regimen. Refills given. Call with any concerns.       Relevant Orders   Ambulatory referral to Obstetrics / Gynecology    Other Visit Diagnoses    Menorrhagia with irregular cycle       Would like to discuss ablation with GYN. Referral to GYN made today. Call with any concerns.    Relevant Orders   Ambulatory referral to Obstetrics / Gynecology       Follow up plan: Return in about 6 months (around 10/15/2018).

## 2018-04-15 ENCOUNTER — Encounter: Payer: Self-pay | Admitting: Family Medicine

## 2018-04-15 NOTE — Assessment & Plan Note (Signed)
Under good control on the detrol. Will continue to space out timed voidings and will call if needs to increase her mediation. Continue current regimen. Refills given. Call with any concerns.

## 2018-04-15 NOTE — Assessment & Plan Note (Signed)
Under good control on the prazosin. Continue current regimen. Refills given. Call with any concerns.

## 2018-04-28 ENCOUNTER — Encounter: Payer: Self-pay | Admitting: Family Medicine

## 2018-04-29 ENCOUNTER — Telehealth: Payer: Self-pay | Admitting: Obstetrics & Gynecology

## 2018-04-29 NOTE — Telephone Encounter (Signed)
[facility] referring for consult for ablation. Called and left voicemail for patient to call back to be schedule

## 2018-05-05 NOTE — Telephone Encounter (Signed)
Patient is schedule 05/14/18 with [facility]

## 2018-05-05 NOTE — Telephone Encounter (Signed)
Called and left voicemail for patient to call back to be schedule

## 2018-05-14 ENCOUNTER — Encounter: Payer: Self-pay | Admitting: Obstetrics & Gynecology

## 2018-10-15 ENCOUNTER — Ambulatory Visit: Payer: Managed Care, Other (non HMO) | Admitting: Family Medicine

## 2018-12-15 ENCOUNTER — Other Ambulatory Visit: Payer: Self-pay | Admitting: Family Medicine

## 2018-12-15 ENCOUNTER — Encounter: Payer: Self-pay | Admitting: Family Medicine

## 2018-12-15 NOTE — Telephone Encounter (Signed)
Needs virtual visit for follow up

## 2018-12-16 NOTE — Telephone Encounter (Signed)
Called pt to set up for virtual visit. Pt states that she will be able to skype. She states that she will give a call back once she checks her schedule.

## 2019-01-29 ENCOUNTER — Encounter: Payer: Self-pay | Admitting: Family Medicine

## 2019-01-29 NOTE — Telephone Encounter (Signed)
Pt scheduled for Monday morning skype visit.

## 2019-02-01 ENCOUNTER — Other Ambulatory Visit: Payer: Self-pay

## 2019-02-01 ENCOUNTER — Ambulatory Visit (INDEPENDENT_AMBULATORY_CARE_PROVIDER_SITE_OTHER): Payer: Managed Care, Other (non HMO) | Admitting: Family Medicine

## 2019-02-01 ENCOUNTER — Encounter: Payer: Self-pay | Admitting: Family Medicine

## 2019-02-01 VITALS — Temp 98.4°F

## 2019-02-01 DIAGNOSIS — F411 Generalized anxiety disorder: Secondary | ICD-10-CM | POA: Diagnosis not present

## 2019-02-01 DIAGNOSIS — M62830 Muscle spasm of back: Secondary | ICD-10-CM | POA: Diagnosis not present

## 2019-02-01 DIAGNOSIS — F515 Nightmare disorder: Secondary | ICD-10-CM

## 2019-02-01 DIAGNOSIS — N3281 Overactive bladder: Secondary | ICD-10-CM | POA: Diagnosis not present

## 2019-02-01 MED ORDER — NAPROXEN 500 MG PO TABS: 500.0000 mg | ORAL_TABLET | Freq: Two times a day (BID) | ORAL | 1 refills | Status: DC

## 2019-02-01 MED ORDER — PRAZOSIN HCL 1 MG PO CAPS: 1.0000 mg | ORAL_CAPSULE | Freq: Every day | ORAL | 1 refills | Status: DC

## 2019-02-01 MED ORDER — CYCLOBENZAPRINE HCL 10 MG PO TABS: 10.0000 mg | ORAL_TABLET | Freq: Every day | ORAL | 0 refills | Status: DC

## 2019-02-01 MED ORDER — TOLTERODINE TARTRATE ER 2 MG PO CP24: 2.0000 mg | ORAL_CAPSULE | Freq: Every day | ORAL | 1 refills | Status: DC

## 2019-02-01 MED ORDER — CLONAZEPAM 0.5 MG PO TABS: 0.2500 mg | ORAL_TABLET | Freq: Two times a day (BID) | ORAL | 0 refills | Status: DC | PRN

## 2019-02-01 NOTE — Assessment & Plan Note (Signed)
Using clonazepam raraely 90 pills lasted 10 months. Continue current regimen. Continue to monitor. Call with any concerns.

## 2019-02-01 NOTE — Assessment & Plan Note (Signed)
Doing well on the prazosin. No issues. Continue current regimen. Refills given.

## 2019-02-01 NOTE — Patient Instructions (Signed)
Shoulder Exercises Ask your health care provider which exercises are safe for you. Do exercises exactly as told by your health care provider and adjust them as directed. It is normal to feel mild stretching, pulling, tightness, or discomfort as you do these exercises, but you should stop right away if you feel sudden pain or your pain gets worse.Do not begin these exercises until told by your health care provider. Range of Motion Exercises        These exercises warm up your muscles and joints and improve the movement and flexibility of your shoulder. These exercises also help to relieve pain, numbness, and tingling. These exercises involve stretching your injured shoulder directly. Exercise A: Pendulum 1. Stand near a wall or a surface that you can hold onto for balance. 2. Bend at the waist and let your left / right arm hang straight down. Use your other arm to support you. Keep your back straight and do not lock your knees. 3. Relax your left / right arm and shoulder muscles, and move your hips and your trunk so your left / right arm swings freely. Your arm should swing because of the motion of your body, not because you are using your arm or shoulder muscles. 4. Keep moving your body so your arm swings in the following directions, as told by your health care provider: ? Side to side. ? Forward and backward. ? In clockwise and counterclockwise circles. 5. Continue each motion for __________ seconds, or for as long as told by your health care provider. 6. Slowly return to the starting position. Repeat __________ times. Complete this exercise __________ times a day. Exercise B:Flexion, Standing 1. Stand and hold a broomstick, a cane, or a similar object. Place your hands a little more than shoulder-width apart on the object. Your left / right hand should be palm-up, and your other hand should be palm-down. 2. Keep your elbow straight and keep your shoulder muscles relaxed. Push the stick  down with your healthy arm to raise your left / right arm in front of your body, and then over your head until you feel a stretch in your shoulder. ? Avoid shrugging your shoulder while you raise your arm. Keep your shoulder blade tucked down toward the middle of your back. 3. Hold for __________ seconds. 4. Slowly return to the starting position. Repeat __________ times. Complete this exercise __________ times a day. Exercise C: Abduction, Standing 1. Stand and hold a broomstick, a cane, or a similar object. Place your hands a little more than shoulder-width apart on the object. Your left / right hand should be palm-up, and your other hand should be palm-down. 2. While keeping your elbow straight and your shoulder muscles relaxed, push the stick across your body toward your left / right side. Raise your left / right arm to the side of your body and then over your head until you feel a stretch in your shoulder. ? Do not raise your arm above shoulder height, unless your health care provider tells you to do that. ? Avoid shrugging your shoulder while you raise your arm. Keep your shoulder blade tucked down toward the middle of your back. 3. Hold for __________ seconds. 4. Slowly return to the starting position. Repeat __________ times. Complete this exercise __________ times a day. Exercise D:Internal Rotation 1. Place your left / right hand behind your back, palm-up. 2. Use your other hand to dangle an exercise band, a towel, or a similar object over your shoulder.   Grasp the band with your left / right hand so you are holding onto both ends. 3. Gently pull up on the band until you feel a stretch in the front of your left / right shoulder. ? Avoid shrugging your shoulder while you raise your arm. Keep your shoulder blade tucked down toward the middle of your back. 4. Hold for __________ seconds. 5. Release the stretch by letting go of the band and lowering your hands. Repeat __________ times.  Complete this exercise __________ times a day. Stretching Exercises  These exercises warm up your muscles and joints and improve the movement and flexibility of your shoulder. These exercises also help to relieve pain, numbness, and tingling. These exercises are done using your healthy shoulder to help stretch the muscles of your injured shoulder. Exercise E: Corner Stretch (External Rotation and Abduction) 1. Stand in a doorway with one of your feet slightly in front of the other. This is called a staggered stance. If you cannot reach your forearms to the door frame, stand facing a corner of a room. 2. Choose one of the following positions as told by your health care provider: ? Place your hands and forearms on the door frame above your head. ? Place your hands and forearms on the door frame at the height of your head. ? Place your hands on the door frame at the height of your elbows. 3. Slowly move your weight onto your front foot until you feel a stretch across your chest and in the front of your shoulders. Keep your head and chest upright and keep your abdominal muscles tight. 4. Hold for __________ seconds. 5. To release the stretch, shift your weight to your back foot. Repeat __________ times. Complete this stretch __________ times a day. Exercise F:Extension, Standing 1. Stand and hold a broomstick, a cane, or a similar object behind your back. ? Your hands should be a little wider than shoulder-width apart. ? Your palms should face away from your back. 2. Keeping your elbows straight and keeping your shoulder muscles relaxed, move the stick away from your body until you feel a stretch in your shoulder. ? Avoid shrugging your shoulders while you move the stick. Keep your shoulder blade tucked down toward the middle of your back. 3. Hold for __________ seconds. 4. Slowly return to the starting position. Repeat __________ times. Complete this exercise __________ times a  day. Strengthening Exercises           These exercises build strength and endurance in your shoulder. Endurance is the ability to use your muscles for a long time, even after they get tired. Exercise G:External Rotation 1. Sit in a stable chair without armrests. 2. Secure an exercise band at elbow height on your left / right side. 3. Place a soft object, such as a folded towel or a small pillow, between your left / right upper arm and your body to move your elbow a few inches away (about 10 cm) from your side. 4. Hold the end of the band so it is tight and there is no slack. 5. Keeping your elbow pressed against the soft object, move your left / right forearm out, away from your abdomen. Keep your body steady so only your forearm moves. 6. Hold for __________ seconds. 7. Slowly return to the starting position. Repeat __________ times. Complete this exercise __________ times a day. Exercise H:Shoulder Abduction 1. Sit in a stable chair without armrests, or stand. 2. Hold a __________ weight in your   left / right hand, or hold an exercise band with both hands. 3. Start with your arms straight down and your left / right palm facing in, toward your body. 4. Slowly lift your left / right hand out to your side. Do not lift your hand above shoulder height unless your health care provider tells you that this is safe. ? Keep your arms straight. ? Avoid shrugging your shoulder while you do this movement. Keep your shoulder blade tucked down toward the middle of your back. 5. Hold for __________ seconds. 6. Slowly lower your arm, and return to the starting position. Repeat __________ times. Complete this exercise __________ times a day. Exercise I:Shoulder Extension 1. Sit in a stable chair without armrests, or stand. 2. Secure an exercise band to a stable object in front of you where it is at shoulder height. 3. Hold one end of the exercise band in each hand. Your palms should face each  other. 4. Straighten your elbows and lift your hands up to shoulder height. 5. Step back, away from the secured end of the exercise band, until the band is tight and there is no slack. 6. Squeeze your shoulder blades together as you pull your hands down to the sides of your thighs. Stop when your hands are straight down by your sides. Do not let your hands go behind your body. 7. Hold for __________ seconds. 8. Slowly return to the starting position. Repeat __________ times. Complete this exercise __________ times a day. Exercise J:Standing Shoulder Row 1. Sit in a stable chair without armrests, or stand. 2. Secure an exercise band to a stable object in front of you so it is at waist height. 3. Hold one end of the exercise band in each hand. Your palms should be in a thumbs-up position. 4. Bend each of your elbows to an "L" shape (about 90 degrees) and keep your upper arms at your sides. 5. Step back until the band is tight and there is no slack. 6. Slowly pull your elbows back behind you. 7. Hold for __________ seconds. 8. Slowly return to the starting position. Repeat __________ times. Complete this exercise __________ times a day. Exercise K:Shoulder Press-Ups 1. Sit in a stable chair that has armrests. Sit upright, with your feet flat on the floor. 2. Put your hands on the armrests so your elbows are bent and your fingers are pointing forward. Your hands should be about even with the sides of your body. 3. Push down on the armrests and use your arms to lift yourself off of the chair. Straighten your elbows and lift yourself up as much as you comfortably can. ? Move your shoulder blades down, and avoid letting your shoulders move up toward your ears. ? Keep your feet on the ground. As you get stronger, your feet should support less of your body weight as you lift yourself up. 4. Hold for __________ seconds. 5. Slowly lower yourself back into the chair. Repeat __________ times. Complete  this exercise __________ times a day. Exercise L: Wall Push-Ups 1. Stand so you are facing a stable wall. Your feet should be about one arm-length away from the wall. 2. Lean forward and place your palms on the wall at shoulder height. 3. Keep your feet flat on the floor as you bend your elbows and lean forward toward the wall. 4. Hold for __________ seconds. 5. Straighten your elbows to push yourself back to the starting position. Repeat __________ times. Complete this exercise __________ times   a day. This information is not intended to replace advice given to you by your health care provider. Make sure you discuss any questions you have with your health care provider. Document Released: 07/24/2005 Document Revised: 01/13/2018 Document Reviewed: 05/21/2015 [employer] Interactive Patient Education  2019 [employer]  

## 2019-02-01 NOTE — Assessment & Plan Note (Signed)
Under good control on current regimen. Continue current regimen. Continue to monitor. Call with any concerns. Refills given.   

## 2019-02-01 NOTE — Progress Notes (Signed)
Temp 98.4 F (36.9 C) (Oral)    Subjective:    Patient ID: Heidi Taylor, female    DOB: 05-16-1971, 48 y.o.   MRN: [id number]  HPI: Heidi Taylor is a 48 y.o. female  Chief Complaint  Patient presents with  . Anxiety  . Medication Refill    "klonopin for my anxiety and also my flexeril"  . Shoulder Injury    Pulled muscle in R shoulder doing yard work   ANXIETY/DEPRESSION Duration:controlled Anxious mood: yes  Excessive worrying: no Irritability: no  Sweating: no Nausea: no Palpitations:no Hyperventilation: no Panic attacks: no Agoraphobia: no  Obscessions/compulsions: no Depressed mood: no Depression screen [facility] 2/9 02/01/2019 03/06/2018 09/02/2017 06/03/2017 03/03/2017  Decreased Interest 1 1 1 1 1   Down, Depressed, Hopeless 1 1 1 1 1   PHQ - 2 Score 2 2 2 2 2   Altered sleeping 0 1 1 - -  Tired, decreased energy 0 1 1 - -  Change in appetite 0 1 1 - -  Feeling bad or failure about yourself  0 0 0 - -  Trouble concentrating 0 0 0 - -  Moving slowly or fidgety/restless 0 0 0 - -  Suicidal thoughts 0 0 0 - -  PHQ-9 Score 2 5 5  - -  Difficult doing work/chores Not difficult at all Somewhat difficult Not difficult at all - Somewhat difficult   GAD 7 : Generalized Anxiety Score 09/02/2017 06/03/2017 01/27/2017 09/06/2016  Nervous, Anxious, on Edge 1 2 3 1   Control/stop worrying 0 1 3 0  Worry too much - different things 0 1 3 1   Trouble relaxing 1 1 3 1   Restless 0 0 2 0  Easily annoyed or irritable 1 1 3 1   Afraid - awful might happen 0 0 3 0  Total GAD 7 Score 3 6 20 4   Anxiety Difficulty Somewhat difficult Very difficult Very difficult Somewhat difficult   Anhedonia: no Weight changes: no Insomnia: no   Hypersomnia: no Fatigue/loss of energy: no Feelings of worthlessness: no Feelings of guilt: no Impaired concentration/indecisiveness: no Suicidal ideations: no  Crying spells: no Recent Stressors/Life Changes: yes   Relationship problems: no   Family  stress: no     Financial stress: no    Job stress: no    Recent death/loss: no  Was moving banana plants this weekend and started with pain and spasm under her R shoulder blade. It has been tight and aching and radiating into her neck. Better with stretching- but having trouble stretching it. Would like a refill on her flexeril.   Bladder has been doing well. Still having trouble with leaking, but a lot less. No issues with nightmares. Doing well with the prazosin. Feeling well otherwise with no other concerns or complaints at this time.  Relevant past medical, surgical, family and social history reviewed and updated as indicated. Interim medical history since our last visit reviewed. Allergies and medications reviewed and updated.  Review of Systems  Constitutional: Negative.   Respiratory: Negative.   Cardiovascular: Negative.   Musculoskeletal: Positive for myalgias. Negative for arthralgias, back pain, gait problem, joint swelling, neck pain and neck stiffness.  Skin: Negative.   Neurological: Negative.   Psychiatric/Behavioral: Negative.     Per HPI unless specifically indicated above     Objective:    Temp 98.4 F (36.9 C) (Oral)   Wt Readings from Last 3 Encounters:  04/14/18 212 lb 3 oz (96.2 kg)  03/06/18 219 lb 4 oz (99.5 kg)  10/01/17 212 lb 1.6 oz (96.2 kg)    Physical Exam Vitals signs and nursing note reviewed.  Constitutional:      General: She is not in acute distress.    Appearance: Normal appearance. She is not ill-appearing, toxic-appearing or diaphoretic.  HENT:     Head: Normocephalic and atraumatic.     Right Ear: External ear normal.     Left Ear: External ear normal.     Nose: Nose normal.     Mouth/Throat:     Mouth: Mucous membranes are moist.     Pharynx: Oropharynx is clear.  Eyes:     General: No scleral icterus.       Right eye: No discharge.        Left eye: No discharge.     Conjunctiva/sclera: Conjunctivae normal.     Pupils: Pupils  are equal, round, and reactive to light.  Neck:     Musculoskeletal: Normal range of motion.  Pulmonary:     Effort: Pulmonary effort is normal. No respiratory distress.     Comments: Speaking in full sentences Musculoskeletal: Normal range of motion.  Skin:    Coloration: Skin is not jaundiced or pale.     Findings: No bruising, erythema, lesion or rash.  Neurological:     Mental Status: She is alert and oriented to person, place, and time. Mental status is at baseline.  Psychiatric:        Mood and Affect: Mood normal.        Behavior: Behavior normal.        Thought Content: Thought content normal.        Judgment: Judgment normal.     Results for orders placed or performed in visit on 03/06/18  Microscopic Examination  Result Value Ref Range   WBC, UA 0-5 0 - 5 /hpf   RBC, UA 0-2 0 - 2 /hpf   Epithelial Cells (non renal) 0-10 0 - 10 /hpf   Bacteria, UA None seen None seen/Few  CBC with Differential/Platelet  Result Value Ref Range   WBC 11.9 (H) 3.4 - 10.8 x10E3/uL   RBC 4.96 3.77 - 5.28 x10E6/uL   Hemoglobin 12.7 11.1 - 15.9 g/dL   Hematocrit 40.0 34.0 - 46.6 %   MCV 81 79 - 97 fL   MCH 25.6 (L) 26.6 - 33.0 pg   MCHC 31.8 31.5 - 35.7 g/dL   RDW 15.3 12.3 - 15.4 %   Platelets 494 (H) 150 - 450 x10E3/uL   Neutrophils 68 Not Estab. %   Lymphs 23 Not Estab. %   Monocytes 7 Not Estab. %   Eos 1 Not Estab. %   Basos 1 Not Estab. %   Neutrophils Absolute 8.1 (H) 1.4 - 7.0 x10E3/uL   Lymphocytes Absolute 2.8 0.7 - 3.1 x10E3/uL   Monocytes Absolute 0.9 0.1 - 0.9 x10E3/uL   EOS (ABSOLUTE) 0.1 0.0 - 0.4 x10E3/uL   Basophils Absolute 0.1 0.0 - 0.2 x10E3/uL   Immature Granulocytes 0 Not Estab. %   Immature Grans (Abs) 0.0 0.0 - 0.1 x10E3/uL  Comprehensive metabolic panel  Result Value Ref Range   Glucose 100 (H) 65 - 99 mg/dL   BUN 12 6 - 24 mg/dL   Creatinine, Ser 0.87 0.57 - 1.00 mg/dL   GFR calc non Af Amer 80 >59 mL/min/1.73   GFR calc Af Amer 92 >59 mL/min/1.73    BUN/Creatinine Ratio 14 9 - 23   Sodium 137 134 - 144 mmol/L  Potassium 4.4 3.5 - 5.2 mmol/L   Chloride 102 96 - 106 mmol/L   CO2 22 20 - 29 mmol/L   Calcium 9.4 8.7 - 10.2 mg/dL   Total Protein 7.2 6.0 - 8.5 g/dL   Albumin 4.2 3.5 - 5.5 g/dL   Globulin, Total 3.0 1.5 - 4.5 g/dL   Albumin/Globulin Ratio 1.4 1.2 - 2.2   Bilirubin Total 0.5 0.0 - 1.2 mg/dL   Alkaline Phosphatase 75 39 - 117 IU/L   AST 17 0 - 40 IU/L   ALT 19 0 - 32 IU/L  Lipid Panel w/o Chol/HDL Ratio  Result Value Ref Range   Cholesterol, Total 198 100 - 199 mg/dL   Triglycerides 77 0 - 149 mg/dL   HDL 57 >39 mg/dL   VLDL Cholesterol Cal 15 5 - 40 mg/dL   LDL Calculated 126 (H) 0 - 99 mg/dL  TSH  Result Value Ref Range   TSH 2.220 0.450 - 4.500 uIU/mL  UA/M w/rflx Culture, Routine  Result Value Ref Range   Specific Gravity, UA 1.015 1.005 - 1.030   pH, UA 5.0 5.0 - 7.5   Color, UA Yellow Yellow   Appearance Ur Clear Clear   Leukocytes, UA Negative Negative   Protein, UA Negative Negative/Trace   Glucose, UA Negative Negative   Ketones, UA Negative Negative   RBC, UA Trace (A) Negative   Bilirubin, UA Negative Negative   Urobilinogen, Ur 0.2 0.2 - 1.0 mg/dL   Nitrite, UA Negative Negative   Microscopic Examination See below:   Measles/Mumps/Rubella Immunity  Result Value Ref Range   Rubella Antibodies, IGG 7.02 Immune >0.99 index   RUBEOLA AB, IGG 103.0 Immune >29.9 AU/mL   MUMPS ABS, IGG <9.0 (L) Immune >10.9 AU/mL  Hgb A1c w/o eAG  Result Value Ref Range   Hgb A1c MFr Bld 5.7 (H) 4.8 - 5.6 %  Specimen status report  Result Value Ref Range   specimen status report Comment       Assessment & Plan:   Problem List Items Addressed This Visit      Nervous and Auditory   Nightmares    Doing well on the prazosin. No issues. Continue current regimen. Refills given.         Genitourinary   Overactive bladder    Under good control on current regimen. Continue current regimen. Continue to monitor.  Call with any concerns. Refills given.          Other   Anxiety disorder - Primary    Using clonazepam raraely 90 pills lasted 10 months. Continue current regimen. Continue to monitor. Call with any concerns.        Other Visit Diagnoses    Muscle spasm of back       Will treat with exercises and stretches. Call with any concerns. Continue to monitor.       Follow up plan: Return in about 3 months (around 05/04/2019) for Physical.    . This visit was completed via Skype due to the restrictions of the COVID-19 pandemic. All issues as above were discussed and addressed. Physical exam was done as above through visual confirmation on Skype. If it was felt that the patient should be evaluated in the office, they were directed there. The patient verbally consented to this visit. . Location of the patient: home . Location of the provider: home . Those involved with this call:  . Provider: Park Liter, DO . CMA: Gerda Diss, CMA . Front Desk/Registration:  Linard Millers  . Time spent on call: 25 minutes with patient face to face via video conference. More than 50% of this time was spent in counseling and coordination of care. 40 minutes total spent in review of patient's record and preparation of their chart.

## 2019-02-06 DIAGNOSIS — M5030 Other cervical disc degeneration, unspecified cervical region: Secondary | ICD-10-CM | POA: Insufficient documentation

## 2019-02-10 ENCOUNTER — Encounter: Payer: Self-pay | Admitting: Family Medicine

## 2019-02-11 NOTE — Telephone Encounter (Signed)
Spoke with pt and she stated that she had spoken with you about her shoulder to spasm. She would like to know if she could have something for pain instead of having another appt.

## 2019-02-12 ENCOUNTER — Encounter: Payer: Self-pay | Admitting: Family Medicine

## 2019-02-14 ENCOUNTER — Other Ambulatory Visit: Payer: Self-pay

## 2019-02-14 ENCOUNTER — Emergency Department
Admission: EM | Admit: 2019-02-14 | Discharge: 2019-02-14 | Disposition: A | Discharge status: Home / Self Care | Payer: Managed Care, Other (non HMO) | Attending: Emergency Medicine | Admitting: Emergency Medicine

## 2019-02-14 ENCOUNTER — Emergency Department: Payer: Managed Care, Other (non HMO)

## 2019-02-14 ENCOUNTER — Encounter: Payer: Self-pay | Admitting: Emergency Medicine

## 2019-02-14 DIAGNOSIS — Z79899 Other long term (current) drug therapy: Secondary | ICD-10-CM | POA: Insufficient documentation

## 2019-02-14 DIAGNOSIS — Z87891 Personal history of nicotine dependence: Secondary | ICD-10-CM | POA: Diagnosis not present

## 2019-02-14 DIAGNOSIS — M79601 Pain in right arm: Secondary | ICD-10-CM | POA: Diagnosis not present

## 2019-02-14 IMAGING — MR MRI CERVICAL SPINE WITHOUT CONTRAST
5 series · 39 of 48 positions shown · non-contrast
Comparison: None.

EXAM:
MRI CERVICAL SPINE WITHOUT CONTRAST
TECHNIQUE: Multiplanar, multisequence MR imaging of the cervical spine was
performed. No intravenous contrast was administered.

[Series 5: T2 · sagittal · 3.0mm · 0.62mm/px · 6 of 15 slices shown (1 of 2)]
[im 1/15]
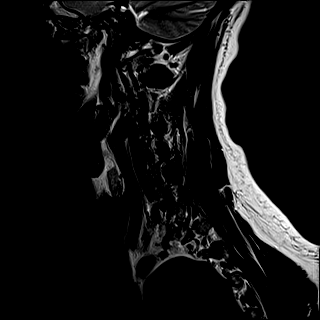
[im 3/15]
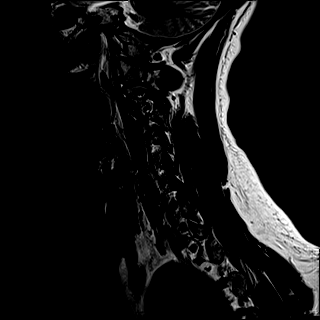
[im 6/15]
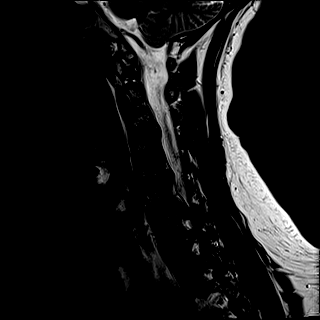
[im 9/15]
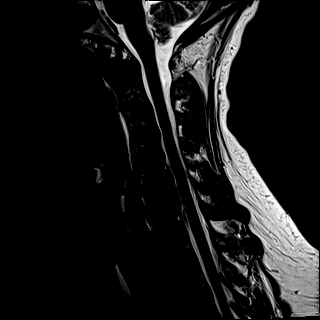
[im 12/15]
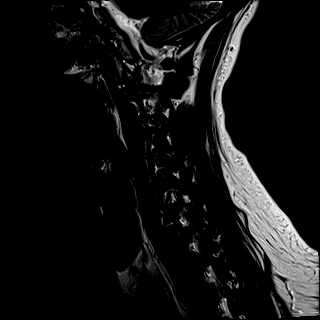
[im 15/15]
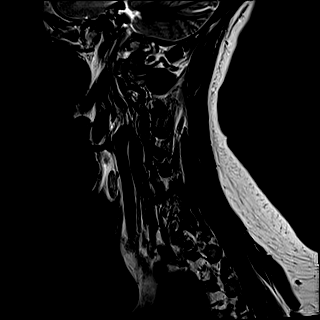

[Series 6: FLAIR · sagittal · 3.0mm · 0.78mm/px · 7 of 15 slices shown]
[im 1/15]
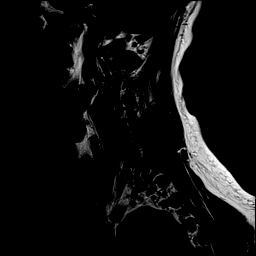
[im 3/15]
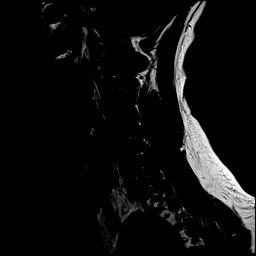
[im 5/15]
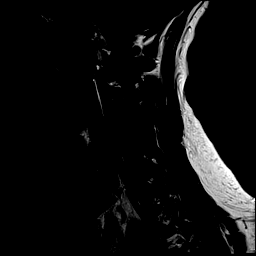
[im 8/15]
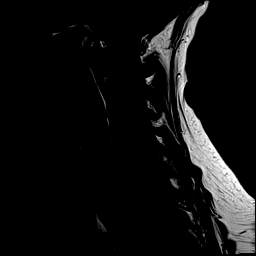
[im 10/15]
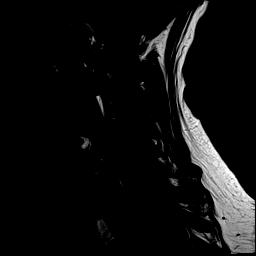
[im 12/15]
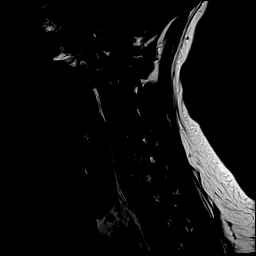
[im 15/15]
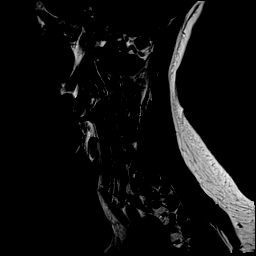

[Series 7: STIR · sagittal · 3.0mm · 0.62mm/px · 7 of 15 slices shown]
[im 1/15]
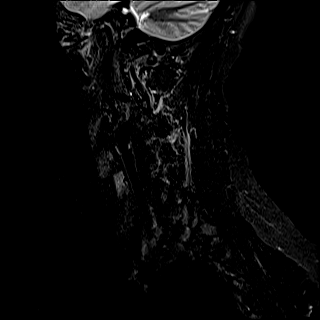
[im 3/15]
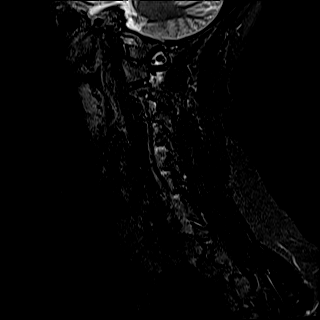
[im 5/15]
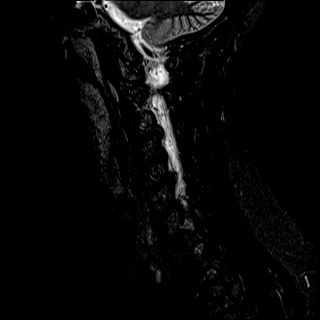
[im 8/15]
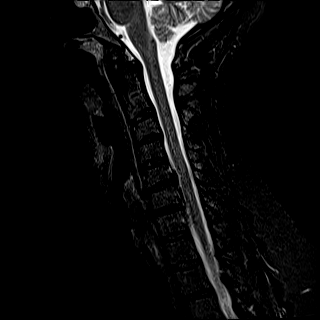
[im 10/15]
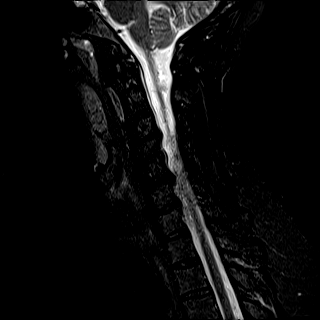
[im 12/15]
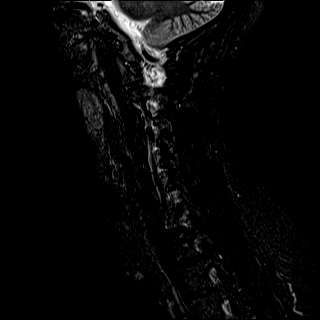
[im 15/15]
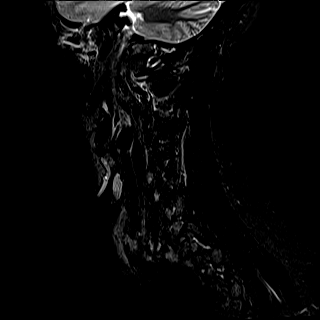

[Series 8: T2 · axial · 3.0mm · 0.70mm/px · z∈[-72,+23]mm · 11 of 29 slices shown (2 of 2)]
[im 1/29]
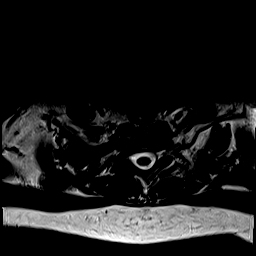
[im 3/29]
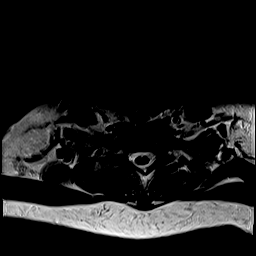
[im 5/29]
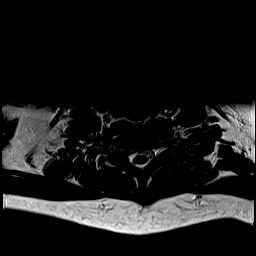
[im 7/29]
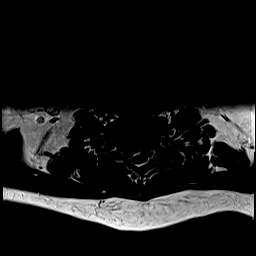
[im 9/29]
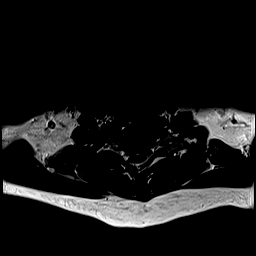
[im 11/29]
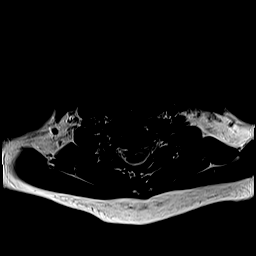
[im 13/29]
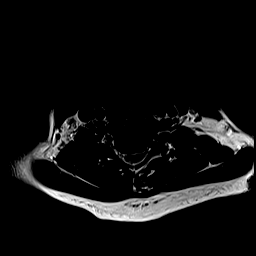
[im 16/29]
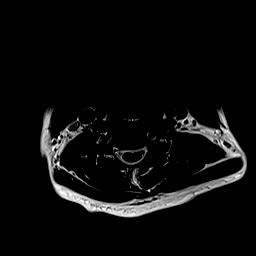
[im 20/29]
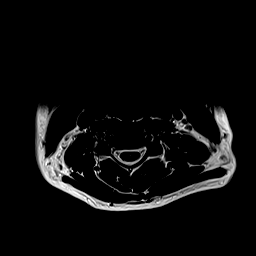
[im 24/29]
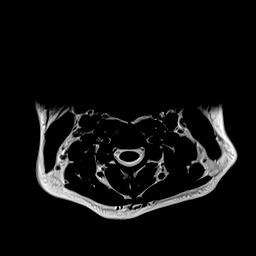
[im 29/29]
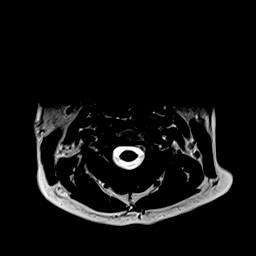

[Series 9: ax mpgr · axial · 3.0mm · 0.35mm/px · z∈[-72,+23]mm · 8 of 29 slices shown]
[im 1/29]
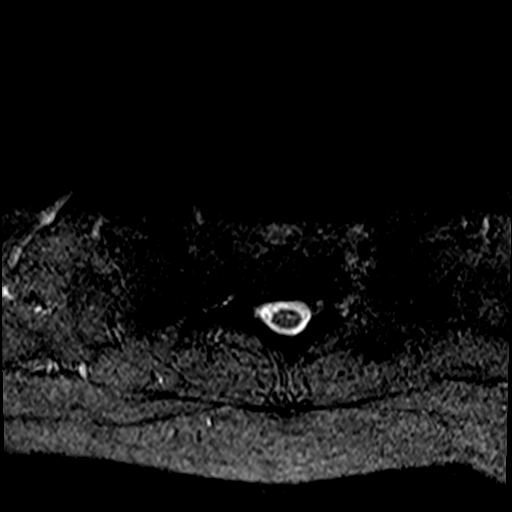
[im 5/29]
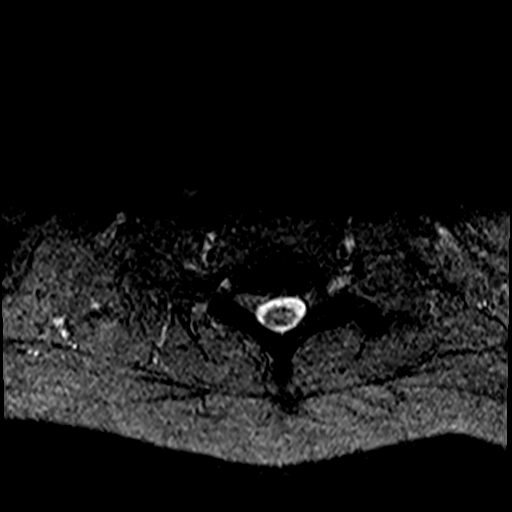
[im 9/29]
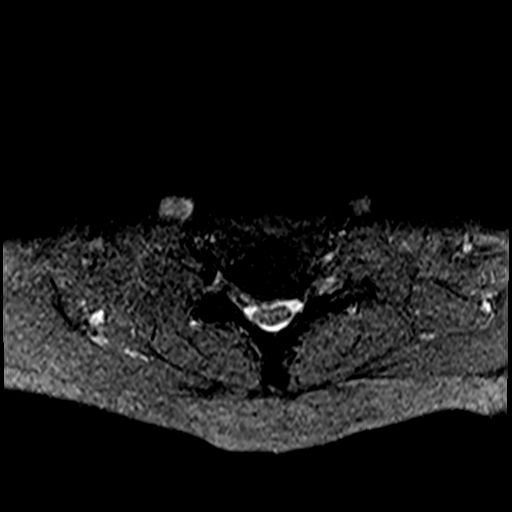
[im 13/29]
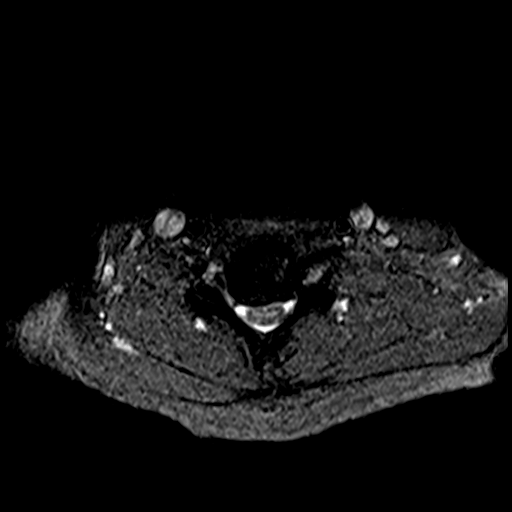
[im 16/29]
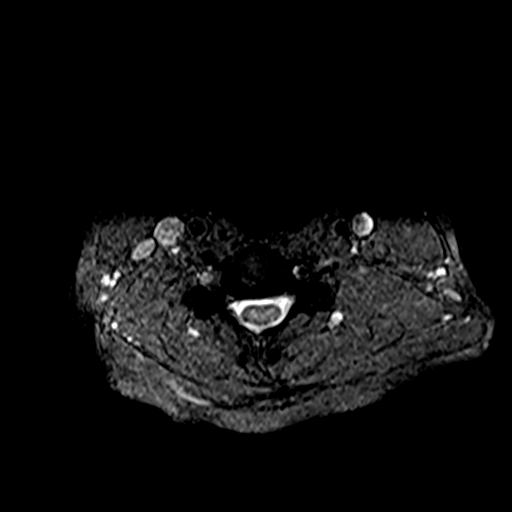
[im 20/29]
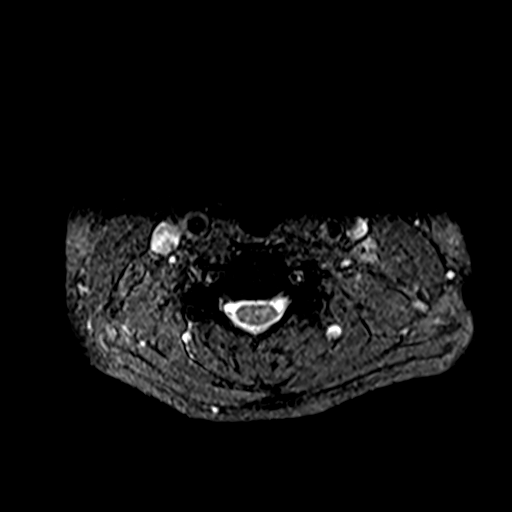
[im 24/29]
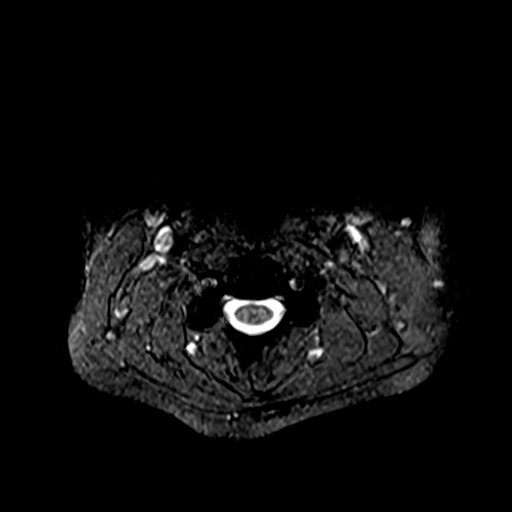
[im 29/29]
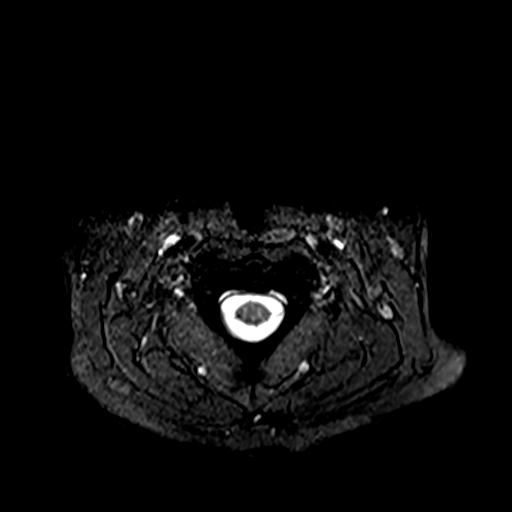

[39 of 48 positions shown; findings below may reference images not displayed]

FINDINGS: Alignment: Physiologic.

Vertebrae: No fracture, evidence of discitis, or bone lesion.

Cord: Normal signal and morphology.

Posterior Fossa, vertebral arteries, paraspinal tissues: Posterior
fossa demonstrates no focal abnormality. Vertebral artery flow voids
are maintained. Paraspinal soft tissues are unremarkable.

Disc levels:

Discs: Degenerative disease with disc height loss C5-6.

C2-3: No significant disc bulge. No neural foraminal stenosis. No
central canal stenosis.

C3-4: No significant disc bulge. Mild bilateral foraminal stenosis.
No central canal stenosis.

C4-5: No significant disc bulge. No neural foraminal stenosis. No
central canal stenosis.

C5-6: Broad-based disc bulge with a shallow broad right paracentral
disc protrusion. Left uncovertebral degenerative changes. Moderate
left foraminal stenosis. Mild right foraminal stenosis. No central
canal stenosis.

C6-7: Right paracentral disc extrusion with extension caudal
migration of disc material extending to the roof of C7-T1 foramen
likely impinging upon the C8 nerve root. No central canal stenosis.

C7-T1: No significant disc bulge. No neural foraminal stenosis. No
central canal stenosis.
IMPRESSION: 1. At C6-7 there is a right paracentral disc extrusion with
extension caudal migration of disc material extending to the roof of
C7-T1 foramen likely impinging upon the C8 nerve root.
2. At C5-6 there is a broad-based disc bulge with a shallow broad
right paracentral disc protrusion. Left uncovertebral degenerative
changes. Moderate left foraminal stenosis. Mild right foraminal
stenosis.

## 2019-02-14 MED ORDER — OXYCODONE-ACETAMINOPHEN 5-325 MG PO TABS
1.0000 | ORAL_TABLET | Freq: Once | ORAL | Status: AC
  Administered 2019-02-14: 16:00:00 1 via ORAL
  Filled 2019-02-14: qty 1

## 2019-02-14 MED ORDER — ONDANSETRON HCL 4 MG PO TABS: 4.0000 mg | ORAL_TABLET | Freq: Three times a day (TID) | ORAL | 0 refills | Status: AC | PRN

## 2019-02-14 MED ORDER — PREDNISONE 10 MG (21) PO TBPK: ORAL_TABLET | ORAL | 0 refills | Status: DC

## 2019-02-14 MED ORDER — ONDANSETRON 4 MG PO TBDP
4.0000 mg | ORAL_TABLET | Freq: Once | ORAL | Status: AC
  Administered 2019-02-14: 4 mg via ORAL
  Filled 2019-02-14: qty 1

## 2019-02-14 MED ORDER — OXYCODONE-ACETAMINOPHEN 5-325 MG PO TABS: 1.0000 | ORAL_TABLET | Freq: Four times a day (QID) | ORAL | 0 refills | Status: AC | PRN

## 2019-02-14 MED ORDER — LORAZEPAM 1 MG PO TABS
1.0000 mg | ORAL_TABLET | Freq: Once | ORAL | Status: AC
  Administered 2019-02-14: 1 mg via ORAL
  Filled 2019-02-14: qty 1

## 2019-02-14 NOTE — Discharge Instructions (Signed)
Start tapered steroid. You can take Percocet and Zofran for pain.  Realized that Percocet can sometimes cause nausea and that is what the Zofran is for. You have been given a referral to neurosurgery, Dr. Cari Caraway. Return to the emergency department with new or worsening symptoms.

## 2019-02-14 NOTE — ED Provider Notes (Signed)
[facility] Emergency Department Provider Note  ____________________________________________  Time seen: Approximately 3:51 PM  I have reviewed the triage vital signs and the nursing notes.   HISTORY  Chief Complaint Arm Pain    HPI Heidi Taylor is a 48 y.o. female with a history of anxiety, presents to the emergency department with right upper extremity weakness.  Patient has been seen and evaluated at 3 prior encounters for right upper extremity pain.  Patient states she was originally diagnosed with right shoulder pain and states that medication she was treated with did not help.  Patient has had numbness and tingling along right upper extremity and patient states it is not difficult for her to make a fist.  She denies falls or mechanisms of trauma.  Patient has had symptoms and working out in the yard.  She describes tingling is occurring primarily along the right fourth and fifth digits and along the medial aspect of the right forearm.  Patient has tried tramadol, Norco and prednisone without relief.        Past Medical History:  Diagnosis Date  . Anxiety   . DDD (degenerative disc disease), lumbar     Patient Active Problem List   Diagnosis Date Noted  . Overactive bladder 03/06/2018  . Nightmares 03/06/2018  . Grief at loss of child 01/27/2017  . DDD (degenerative disc disease), lumbosacral 12/06/2016  . Anxiety disorder 09/06/2016    Past Surgical History:  Procedure Laterality Date  . HERNIA REPAIR      Prior to Admission medications   Medication Sig Start Date End Date Taking? Authorizing Provider  clonazePAM (KLONOPIN) 0.5 MG tablet Take 0.5-1 tablets (0.25-0.5 mg total) by mouth 2 (two) times daily as needed for anxiety. 02/01/19   Johnson, [name], DO  cyclobenzaprine (FLEXERIL) 10 MG tablet Take 1 tablet (10 mg total) by mouth at bedtime. 02/01/19   Johnson, [name], DO  naproxen (NAPROSYN) 500 MG tablet Take 1 tablet (500 mg total)  by mouth 2 (two) times daily with a meal. 02/01/19   Johnson, [name], DO  ondansetron (ZOFRAN) 4 MG tablet Take 1 tablet (4 mg total) by mouth every 8 (eight) hours as needed for up to 5 days for nausea or vomiting. 02/14/19 02/19/19  Lannie Fields, PA-C  oxyCODONE-acetaminophen (PERCOCET/ROXICET) 5-325 MG tablet Take 1 tablet by mouth every 6 (six) hours as needed for up to 3 days for severe pain. 02/14/19 02/17/19  Lannie Fields, PA-C  prazosin (MINIPRESS) 1 MG capsule Take 1 capsule (1 mg total) by mouth at bedtime. 02/01/19   Johnson, [name], DO  predniSONE (STERAPRED UNI-PAK 21 TAB) 10 MG (21) TBPK tablet Take 6 tabs the the 1st day. Take 6 tabs the the 2nd day. Take 5 tabs the the 3rd day. Take 5 tabs the 4th day. Take 4 tabs the the 5th day.Take 4 tabs the the 6th day.Take 3 tabs the 7th day.Take 3 tabs the 8th day. Take 2 tabs the 9th day. Take 2 tabs the 10th day. Take 1 tab the 11th day. Take 1 tab the 12th day.\ 02/14/19   Lannie Fields, PA-C  tolterodine (DETROL LA) 2 MG 24 hr capsule Take 1 capsule (2 mg total) by mouth daily. 02/01/19   Park Liter P, DO    Allergies Duloxetine; Sertraline; and Trintellix [vortioxetine]  Family History  Problem Relation Age of Onset  . Cancer Mother        Brain  . Parkinson's disease  Father   . Parkinson's disease Paternal Grandfather   . Breast cancer Maternal Aunt     Social History Social History   Tobacco Use  . Smoking status: Former Smoker    Last attempt to quit: 09/06/2009    Years since quitting: 9.4  . Smokeless tobacco: Never Used  Substance Use Topics  . Alcohol use: Yes    Comment: On occasion  . Drug use: No     Review of Systems  Constitutional: No fever/chills Eyes: No visual changes. No discharge ENT: No upper respiratory complaints. Cardiovascular: no chest pain. Respiratory: no cough. No SOB. Gastrointestinal: No abdominal pain.  No nausea, no vomiting.  No diarrhea.  No constipation. Musculoskeletal:  Patient has neck pain and right upper extremity pain.  Skin: Negative for rash, abrasions, lacerations, ecchymosis. Neurological: Negative for headaches, focal weakness or numbness.   ____________________________________________   PHYSICAL EXAM:  VITAL SIGNS: ED Triage Vitals  Enc Vitals Group     BP 02/14/19 1216 140/90     Pulse Rate 02/14/19 1216 (!) 106     Resp 02/14/19 1216 16     Temp 02/14/19 1216 98.2 F (36.8 C)     Temp Source 02/14/19 1216 Oral     SpO2 02/14/19 1216 96 %     Weight --      Height --      Head Circumference --      Peak Flow --      Pain Score 02/14/19 1212 8     Pain Loc --      Pain Edu? --      Excl. in [place]? --      Constitutional: Alert and oriented. Well appearing and in no acute distress. Eyes: Conjunctivae are normal. PERRL. EOMI. Head: Atraumatic. Cardiovascular: Normal rate, regular rhythm. Normal S1 and S2.  Good peripheral circulation. Respiratory: Normal respiratory effort without tachypnea or retractions. Lungs CTAB. Good air entry to the bases with no decreased or absent breath sounds. Musculoskeletal: Patient has 5 out of 5 strength in the upper extremities bilaterally and symmetrically.  No weakness with right rotator cuff testing.  Radiculopathy is elicited with range of motion testing at the neck. Neurologic:  Normal speech and language. No gross focal neurologic deficits are appreciated.  Skin:  Skin is warm, dry and intact. No rash noted. Psychiatric: Mood and affect are normal. Speech and behavior are normal. Patient exhibits appropriate insight and judgement.   ____________________________________________   LABS (all labs ordered are listed, but only abnormal results are displayed)  Labs Reviewed - No data to display ____________________________________________  EKG   ____________________________________________  RADIOLOGY I personally viewed and evaluated these images as part of my medical decision making, as  well as reviewing the written report by the radiologist.    Mr Cervical Spine Wo Contrast  Result Date: 02/14/2019 EXAM: MRI CERVICAL SPINE WITHOUT CONTRAST TECHNIQUE: Multiplanar, multisequence MR imaging of the cervical spine was performed. No intravenous contrast was administered. COMPARISON:  None. FINDINGS: Alignment: Physiologic. Vertebrae: No fracture, evidence of discitis, or bone lesion. Cord: Normal signal and morphology. Posterior Fossa, vertebral arteries, paraspinal tissues: Posterior fossa demonstrates no focal abnormality. Vertebral artery flow voids are maintained. Paraspinal soft tissues are unremarkable. Disc levels: Discs: Degenerative disease with disc height loss C5-6. C2-3: No significant disc bulge. No neural foraminal stenosis. No central canal stenosis. C3-4: No significant disc bulge. Mild bilateral foraminal stenosis. No central canal stenosis. C4-5: No significant disc bulge. No neural foraminal stenosis. No  central canal stenosis. C5-6: Broad-based disc bulge with a shallow broad right paracentral disc protrusion. Left uncovertebral degenerative changes. Moderate left foraminal stenosis. Mild right foraminal stenosis. No central canal stenosis. C6-7: Right paracentral disc extrusion with extension caudal migration of disc material extending to the roof of C7-T1 foramen likely impinging upon the C8 nerve root. No central canal stenosis. C7-T1: No significant disc bulge. No neural foraminal stenosis. No central canal stenosis. IMPRESSION: 1. At C6-7 there is a right paracentral disc extrusion with extension caudal migration of disc material extending to the roof of C7-T1 foramen likely impinging upon the C8 nerve root. 2. At C5-6 there is a broad-based disc bulge with a shallow broad right paracentral disc protrusion. Left uncovertebral degenerative changes. Moderate left foraminal stenosis. Mild right foraminal stenosis. Electronically Signed   By: Kathreen Devoid   On: 02/14/2019  17:36    ____________________________________________    PROCEDURES  Procedure(s) performed:    Procedures    Medications  oxyCODONE-acetaminophen (PERCOCET/ROXICET) 5-325 MG per tablet 1 tablet (1 tablet Oral Given 02/14/19 1615)  ondansetron (ZOFRAN-ODT) disintegrating tablet 4 mg (4 mg Oral Given 02/14/19 1616)  LORazepam (ATIVAN) tablet 1 mg (1 mg Oral Given 02/14/19 1615)     ____________________________________________   INITIAL IMPRESSION / ASSESSMENT AND PLAN / ED COURSE  Pertinent labs & imaging results that were available during my care of the patient were reviewed by me and considered in my medical decision making (see chart for details).  Review of the [place] CSRS was performed in accordance of the [place] prior to dispensing any controlled drugs.           Assessment and Plan: Cervical disc extrusion Right upper extremity pain 48 year old female presents to the emergency department with right upper extremity pain that has occurred for approximately 1 week.  Patient has had paresthesias along the C5, C6 and C8 dermatomes.   Differential diagnosis included cervical radiculopathy, herniated disc/disc extrusion, and cubital tunnel syndrome...  MRI shows a disc extrusion at C6-C7 which is likely leading to impingement of the C8 dermatome.  Patient also has a broad-based disc bulge at C5-C6.  Dr. Zada Finders, neurosurgeon on-call with [facility] was consulted who agreed with treatment with high-dose steroids and follow-up with outpatient neurosurgery, Dr. Cari Caraway.  Patient education regarding follow-up was given and patient voiced understanding.  Strict return precautions were given to return to the emergency department with new or worsening symptoms.  All patient questions were answered.     ____________________________________________  FINAL CLINICAL IMPRESSION(S) / ED DIAGNOSES  Final diagnoses:  Right arm pain      NEW MEDICATIONS STARTED DURING THIS  VISIT:  ED Discharge Orders         Ordered    predniSONE (STERAPRED UNI-PAK 21 TAB) 10 MG (21) TBPK tablet     02/14/19 1817    oxyCODONE-acetaminophen (PERCOCET/ROXICET) 5-325 MG tablet  Every 6 hours PRN     02/14/19 1817    ondansetron (ZOFRAN) 4 MG tablet  Every 8 hours PRN     02/14/19 1817              This chart was dictated using voice recognition software/Dragon. Despite best efforts to proofread, errors can occur which can change the meaning. Any change was purely unintentional.    Lannie Fields, PA-C 02/14/19 1854    Earleen Newport, MD 02/14/19 [phone]

## 2019-02-14 NOTE — ED Triage Notes (Signed)
Pt to ED via POV c/o right shoulder and arm pain. Pt states that she has been seen for in the past. Pt was sent to be evaluated for herniated disc. Pt appears uncomfortable at this time.

## 2019-02-14 NOTE — ED Notes (Signed)
Patient was sent here by urgent care for MRI due to possible herniated disc causing her right shoulder/arm pain

## 2019-02-17 NOTE — Telephone Encounter (Signed)
Called pt, no answer, LVM for pt to call back.

## 2019-04-12 ENCOUNTER — Encounter: Payer: Managed Care, Other (non HMO) | Admitting: Family Medicine

## 2019-05-20 ENCOUNTER — Encounter: Payer: Managed Care, Other (non HMO) | Admitting: Family Medicine

## 2019-09-08 ENCOUNTER — Encounter: Payer: Managed Care, Other (non HMO) | Admitting: Family Medicine

## 2019-09-08 ENCOUNTER — Ambulatory Visit: Payer: Managed Care, Other (non HMO) | Admitting: Family Medicine

## 2019-09-13 ENCOUNTER — Other Ambulatory Visit: Payer: Self-pay

## 2019-09-13 ENCOUNTER — Encounter: Payer: Self-pay | Admitting: Family Medicine

## 2019-09-13 ENCOUNTER — Ambulatory Visit: Payer: Managed Care, Other (non HMO) | Admitting: Family Medicine

## 2019-09-13 VITALS — BP 131/87 | HR 88 | Temp 98.8°F | Ht 64.0 in | Wt 203.0 lb

## 2019-09-13 DIAGNOSIS — M5412 Radiculopathy, cervical region: Secondary | ICD-10-CM

## 2019-09-13 DIAGNOSIS — Z1231 Encounter for screening mammogram for malignant neoplasm of breast: Secondary | ICD-10-CM | POA: Diagnosis not present

## 2019-09-13 DIAGNOSIS — Z23 Encounter for immunization: Secondary | ICD-10-CM

## 2019-09-13 DIAGNOSIS — F411 Generalized anxiety disorder: Secondary | ICD-10-CM | POA: Diagnosis not present

## 2019-09-13 DIAGNOSIS — N3281 Overactive bladder: Secondary | ICD-10-CM

## 2019-09-13 DIAGNOSIS — Z0000 Encounter for general adult medical examination without abnormal findings: Secondary | ICD-10-CM | POA: Diagnosis not present

## 2019-09-13 LAB — UA/M W/RFLX CULTURE, ROUTINE
Bilirubin, UA: NEGATIVE
Glucose, UA: NEGATIVE
Ketones, UA: NEGATIVE
Leukocytes,UA: NEGATIVE
Nitrite, UA: NEGATIVE
Protein,UA: NEGATIVE
RBC, UA: NEGATIVE
Specific Gravity, UA: <1.005 — ABNORMAL LOW (ref 1.005–1.030)
Urobilinogen, Ur: 0.2 mg/dL (ref 0.2–1.0)
pH, UA: 5.5 (ref 5.0–7.5)

## 2019-09-13 MED ORDER — TOLTERODINE TARTRATE ER 2 MG PO CP24: 2.0000 mg | ORAL_CAPSULE | Freq: Every day | ORAL | 1 refills | Status: DC

## 2019-09-13 MED ORDER — CLONAZEPAM 0.5 MG PO TABS: 0.2500 mg | ORAL_TABLET | Freq: Two times a day (BID) | ORAL | 0 refills | Status: DC | PRN

## 2019-09-13 MED ORDER — PRAZOSIN HCL 1 MG PO CAPS: 1.0000 mg | ORAL_CAPSULE | Freq: Every day | ORAL | 1 refills | Status: DC

## 2019-09-13 NOTE — Progress Notes (Signed)
BP 131/87   Pulse 88   Temp 98.8 F (37.1 C) (Oral)   Ht 5' 4"  (1.626 m)   Wt 203 lb (92.1 kg)   SpO2 99%   BMI 34.84 kg/m    Subjective:    Patient ID: Heidi Taylor, female    DOB: May 21, 1971, 48 y.o.   MRN: [id number]  HPI: Heidi Taylor is a 48 y.o. female presenting on 09/13/2019 for comprehensive medical examination. Current medical complaints include:see below  Anxiety - Feeling well overall. States 2-3 times per week using the klonopin, varies some depending on what is going on. Denies anhedonia, mood swings, SI/HI. Tolerating medication well and getting good benefit from it.   Overactive bladder - stable, states good symptom control with current regimen. No recent exacerbations in sxs  Seeing Neurosurgery for cervical DDD, getting injections and taking flexeril and gabapentin prn for right UE radicular sxs.   She currently lives with: Menopausal Symptoms: no  Depression Screen done today and results listed below:  Depression screen [facility] 2/9 09/13/2019 02/01/2019 03/06/2018 09/02/2017 06/03/2017  Decreased Interest 1 1 1 1 1   Down, Depressed, Hopeless 1 1 1 1 1   PHQ - 2 Score 2 2 2 2 2   Altered sleeping 2 0 1 1 -  Tired, decreased energy 1 0 1 1 -  Change in appetite 1 0 1 1 -  Feeling bad or failure about yourself  0 0 0 0 -  Trouble concentrating 0 0 0 0 -  Moving slowly or fidgety/restless 0 0 0 0 -  Suicidal thoughts 0 0 0 0 -  PHQ-9 Score 6 2 5 5  -  Difficult doing work/chores - Not difficult at all Somewhat difficult Not difficult at all -    The patient does not have a history of falls. I did complete a risk assessment for falls. A plan of care for falls was documented.   Past Medical History:  Past Medical History:  Diagnosis Date  . Anxiety   . DDD (degenerative disc disease), lumbar     Surgical History:  Past Surgical History:  Procedure Laterality Date  . HERNIA REPAIR      Medications:  Current Outpatient Medications on File Prior to  Visit  Medication Sig  . cyclobenzaprine (FLEXERIL) 10 MG tablet Take 1 tablet (10 mg total) by mouth at bedtime.  . gabapentin (NEURONTIN) 300 MG capsule Take by mouth as needed.   . naproxen (NAPROSYN) 500 MG tablet Take 1 tablet (500 mg total) by mouth 2 (two) times daily with a meal.  . fluticasone (FLONASE) 50 MCG/ACT nasal spray Place into the nose.   No current facility-administered medications on file prior to visit.    Allergies:  Allergies  Allergen Reactions  . Duloxetine Other (See Comments)    GI and weight  . Sertraline Other (See Comments)    GI and weight  . Trintellix [Vortioxetine] Nausea And Vomiting    Social History:  Social History   Socioeconomic History  . Marital status: Married    Spouse name: Not on file  . Number of children: Not on file  . Years of education: Not on file  . Highest education level: Not on file  Occupational History  . Not on file  Tobacco Use  . Smoking status: Former Smoker    Quit date: 09/06/2009    Years since quitting: 10.0  . Smokeless tobacco: Never Used  Substance and Sexual Activity  . Alcohol use: Yes  Comment: On occasion  . Drug use: No  . Sexual activity: Yes    Birth control/protection: None  Other Topics Concern  . Not on file  Social History Narrative  . Not on file   Social Determinants of Health   Financial Resource Strain:   . Difficulty of Paying Living Expenses: Not on file  Food Insecurity:   . Worried About Charity fundraiser in the Last Year: Not on file  . Ran Out of Food in the Last Year: Not on file  Transportation Needs:   . Lack of Transportation (Medical): Not on file  . Lack of Transportation (Non-Medical): Not on file  Physical Activity:   . Days of Exercise per Week: Not on file  . Minutes of Exercise per Session: Not on file  Stress:   . Feeling of Stress : Not on file  Social Connections:   . Frequency of Communication with Friends and Family: Not on file  . Frequency of  Social Gatherings with Friends and Family: Not on file  . Attends Religious Services: Not on file  . Active Member of Clubs or Organizations: Not on file  . Attends Archivist Meetings: Not on file  . Marital Status: Not on file  Intimate Partner Violence:   . Fear of Current or Ex-Partner: Not on file  . Emotionally Abused: Not on file  . Physically Abused: Not on file  . Sexually Abused: Not on file   Social History   Tobacco Use  Smoking Status Former Smoker  . Quit date: 09/06/2009  . Years since quitting: 10.0  Smokeless Tobacco Never Used   Social History   Substance and Sexual Activity  Alcohol Use Yes   Comment: On occasion    Family History:  Family History  Problem Relation Age of Onset  . Cancer Mother        Brain  . Parkinson's disease Father   . Parkinson's disease Paternal Grandfather   . Breast cancer Maternal Aunt     Past medical history, surgical history, medications, allergies, family history and social history reviewed with patient today and changes made to appropriate areas of the chart.   Review of Systems - General ROS: negative Psychological ROS: negative Ophthalmic ROS: negative ENT ROS: negative Allergy and Immunology ROS: negative Hematological and Lymphatic ROS: negative Endocrine ROS: negative Breast ROS: negative for breast lumps Respiratory ROS: no cough, shortness of breath, or wheezing Cardiovascular ROS: no chest pain or dyspnea on exertion Gastrointestinal ROS: no abdominal pain, change in bowel habits, or black or bloody stools Genito-Urinary ROS: no dysuria, trouble voiding, or hematuria Musculoskeletal ROS: negative Neurological ROS: no TIA or stroke symptoms Dermatological ROS: negative All other ROS negative except what is listed above and in the HPI.      Objective:    BP 131/87   Pulse 88   Temp 98.8 F (37.1 C) (Oral)   Ht 5' 4"  (1.626 m)   Wt 203 lb (92.1 kg)   SpO2 99%   BMI 34.84 kg/m   Wt  Readings from Last 3 Encounters:  09/13/19 203 lb (92.1 kg)  02/14/19 212 lb (96.2 kg)  04/14/18 212 lb 3 oz (96.2 kg)    Physical Exam Vitals and nursing note reviewed.  Constitutional:      General: She is not in acute distress.    Appearance: She is well-developed.  HENT:     Head: Atraumatic.     Right Ear: External ear normal.  Left Ear: External ear normal.     Nose: Nose normal.     Mouth/Throat:     Pharynx: No oropharyngeal exudate.  Eyes:     General: No scleral icterus.    Conjunctiva/sclera: Conjunctivae normal.     Pupils: Pupils are equal, round, and reactive to light.  Neck:     Thyroid: No thyromegaly.  Cardiovascular:     Rate and Rhythm: Normal rate and regular rhythm.     Heart sounds: Normal heart sounds.  Pulmonary:     Effort: Pulmonary effort is normal. No respiratory distress.     Breath sounds: Normal breath sounds.  Abdominal:     General: Bowel sounds are normal.     Palpations: Abdomen is soft. There is no mass.     Tenderness: There is no abdominal tenderness.  Genitourinary:    Comments: GU exam deferred with shared decision making Musculoskeletal:        General: No tenderness. Normal range of motion.     Cervical back: Normal range of motion and neck supple.  Lymphadenopathy:     Cervical: No cervical adenopathy.  Skin:    General: Skin is warm and dry.     Findings: No rash.  Neurological:     Mental Status: She is alert and oriented to person, place, and time.     Cranial Nerves: No cranial nerve deficit.  Psychiatric:        Behavior: Behavior normal.     Results for orders placed or performed in visit on 09/13/19  CBC with Differential/Platelet out  Result Value Ref Range   WBC 9.8 3.4 - 10.8 x10E3/uL   RBC 5.35 (H) 3.77 - 5.28 x10E6/uL   Hemoglobin 14.1 11.1 - 15.9 g/dL   Hematocrit 45.3 34.0 - 46.6 %   MCV 85 79 - 97 fL   MCH 26.4 (L) 26.6 - 33.0 pg   MCHC 31.1 (L) 31.5 - 35.7 g/dL   RDW 14.4 11.7 - 15.4 %    Platelets 441 150 - 450 x10E3/uL   Neutrophils 62 Not Estab. %   Lymphs 25 Not Estab. %   Monocytes 8 Not Estab. %   Eos 4 Not Estab. %   Basos 1 Not Estab. %   Neutrophils Absolute 6.1 1.4 - 7.0 x10E3/uL   Lymphocytes Absolute 2.5 0.7 - 3.1 x10E3/uL   Monocytes Absolute 0.8 0.1 - 0.9 x10E3/uL   EOS (ABSOLUTE) 0.4 0.0 - 0.4 x10E3/uL   Basophils Absolute 0.1 0.0 - 0.2 x10E3/uL   Immature Granulocytes 0 Not Estab. %   Immature Grans (Abs) 0.0 0.0 - 0.1 x10E3/uL  Comprehensive metabolic panel  Result Value Ref Range   Glucose 87 65 - 99 mg/dL   BUN 11 6 - 24 mg/dL   Creatinine, Ser 0.99 0.57 - 1.00 mg/dL   GFR calc non Af Amer 68 >59 mL/min/1.73   GFR calc Af Amer 78 >59 mL/min/1.73   BUN/Creatinine Ratio 11 9 - 23   Sodium 142 134 - 144 mmol/L   Potassium 4.9 3.5 - 5.2 mmol/L   Chloride 106 96 - 106 mmol/L   CO2 18 (L) 20 - 29 mmol/L   Calcium 9.2 8.7 - 10.2 mg/dL   Total Protein 7.1 6.0 - 8.5 g/dL   Albumin 4.3 3.8 - 4.8 g/dL   Globulin, Total 2.8 1.5 - 4.5 g/dL   Albumin/Globulin Ratio 1.5 1.2 - 2.2   Bilirubin Total 0.3 0.0 - 1.2 mg/dL   Alkaline Phosphatase 69 39 -  117 IU/L   AST 17 0 - 40 IU/L   ALT 13 0 - 32 IU/L  Lipid Panel w/o Chol/HDL Ratio out  Result Value Ref Range   Cholesterol, Total 209 (H) 100 - 199 mg/dL   Triglycerides 102 0 - 149 mg/dL   HDL 47 >39 mg/dL   VLDL Cholesterol Cal 18 5 - 40 mg/dL   LDL Chol Calc (NIH) 144 (H) 0 - 99 mg/dL  TSH  Result Value Ref Range   TSH 1.990 0.450 - 4.500 uIU/mL  UA/M w/rflx Culture, Routine   Specimen: Urine   URINE  Result Value Ref Range   Specific Gravity, UA <1.005 (L) 1.005 - 1.030   pH, UA 5.5 5.0 - 7.5   Color, UA Yellow Yellow   Appearance Ur Clear Clear   Leukocytes,UA Negative Negative   Protein,UA Negative Negative/Trace   Glucose, UA Negative Negative   Ketones, UA Negative Negative   RBC, UA Negative Negative   Bilirubin, UA Negative Negative   Urobilinogen, Ur 0.2 0.2 - 1.0 mg/dL   Nitrite,  UA Negative Negative      Assessment & Plan:   Problem List Items Addressed This Visit      Genitourinary   Overactive bladder    Stable, well controlled. Continue current regimen        Other   Anxiety disorder - Primary    Stable and under good control, continue current regimen with rare prn use of klonopin       Other Visit Diagnoses    Annual physical exam       Relevant Orders   CBC with Differential/Platelet out (Completed)   Comprehensive metabolic panel (Completed)   Lipid Panel w/o Chol/HDL Ratio out (Completed)   TSH (Completed)   UA/M w/rflx Culture, Routine (Completed)   Cervical radiculopathy       Followed by Neurosurgery, continue per their recommendations   Relevant Medications   gabapentin (NEURONTIN) 300 MG capsule   clonazePAM (KLONOPIN) 0.5 MG tablet   Flu vaccine need       Relevant Orders   Flu Vaccine QUAD 6+ mos PF IM (Fluarix Quad PF) (Completed)   Encounter for screening mammogram for malignant neoplasm of breast       Relevant Orders   MM 3D SCREEN BREAST BILATERAL       Follow up plan: Return in about 6 months (around 03/13/2020) for 6 month f/u with Dr. Wynetta Emery.   LABORATORY TESTING:  - Pap smear: up to date  IMMUNIZATIONS:   - Tdap: Tetanus vaccination status reviewed: last tetanus booster within 10 years. - Influenza: Administered today  SCREENING: -Mammogram: scheduled   PATIENT COUNSELING:   Advised to take 1 mg of folate supplement per day if capable of pregnancy.   Sexuality: Discussed sexually transmitted diseases, partner selection, use of condoms, avoidance of unintended pregnancy  and contraceptive alternatives.   Advised to avoid cigarette smoking.  I discussed with the patient that most people either abstain from alcohol or drink within safe limits (<=14/week and <=4 drinks/occasion for males, <=7/weeks and <= 3 drinks/occasion for females) and that the risk for alcohol disorders and other health effects rises  proportionally with the number of drinks per week and how often a drinker exceeds daily limits.  Discussed cessation/primary prevention of drug use and availability of treatment for abuse.   Diet: Encouraged to adjust caloric intake to maintain  or achieve ideal body weight, to reduce intake of dietary saturated fat and total  fat, to limit sodium intake by avoiding high sodium foods and not adding table salt, and to maintain adequate dietary potassium and calcium preferably from fresh fruits, vegetables, and low-fat dairy products.    stressed the importance of regular exercise  Injury prevention: Discussed safety belts, safety helmets, smoke detector, smoking near bedding or upholstery.   Dental health: Discussed importance of regular tooth brushing, flossing, and dental visits.    NEXT PREVENTATIVE PHYSICAL DUE IN 1 YEAR. Return in about 6 months (around 03/13/2020) for 6 month f/u with Dr. Wynetta Emery.

## 2019-09-14 LAB — LIPID PANEL W/O CHOL/HDL RATIO
Cholesterol, Total: 209 mg/dL — ABNORMAL HIGH (ref 100–199)
HDL: 47 mg/dL (ref >39)
LDL Chol Calc (NIH): 144 mg/dL — ABNORMAL HIGH (ref 0–99)
Triglycerides: 102 mg/dL (ref 0–149)
VLDL Cholesterol Cal: 18 mg/dL (ref 5–40)

## 2019-09-14 LAB — COMPREHENSIVE METABOLIC PANEL
ALT: 13 IU/L (ref 0–32)
AST: 17 IU/L (ref 0–40)
Albumin/Globulin Ratio: 1.5 (ref 1.2–2.2)
Albumin: 4.3 g/dL (ref 3.8–4.8)
Alkaline Phosphatase: 69 IU/L (ref 39–117)
BUN/Creatinine Ratio: 11 (ref 9–23)
BUN: 11 mg/dL (ref 6–24)
Bilirubin Total: 0.3 mg/dL (ref 0.0–1.2)
CO2: 18 mmol/L — ABNORMAL LOW (ref 20–29)
Calcium: 9.2 mg/dL (ref 8.7–10.2)
Chloride: 106 mmol/L (ref 96–106)
Creatinine, Ser: 0.99 mg/dL (ref 0.57–1.00)
GFR calc Af Amer: 78 mL/min/{1.73_m2} (ref >59)
GFR calc non Af Amer: 68 mL/min/{1.73_m2} (ref >59)
Globulin, Total: 2.8 g/dL (ref 1.5–4.5)
Glucose: 87 mg/dL (ref 65–99)
Potassium: 4.9 mmol/L (ref 3.5–5.2)
Sodium: 142 mmol/L (ref 134–144)
Total Protein: 7.1 g/dL (ref 6.0–8.5)

## 2019-09-14 LAB — CBC WITH DIFFERENTIAL/PLATELET
Basophils Absolute: 0.1 10*3/uL (ref 0.0–0.2)
Basos: 1 %
EOS (ABSOLUTE): 0.4 10*3/uL (ref 0.0–0.4)
Eos: 4 %
Hematocrit: 45.3 % (ref 34.0–46.6)
Hemoglobin: 14.1 g/dL (ref 11.1–15.9)
Immature Grans (Abs): 0 10*3/uL (ref 0.0–0.1)
Immature Granulocytes: 0 %
Lymphocytes Absolute: 2.5 10*3/uL (ref 0.7–3.1)
Lymphs: 25 %
MCH: 26.4 pg — ABNORMAL LOW (ref 26.6–33.0)
MCHC: 31.1 g/dL — ABNORMAL LOW (ref 31.5–35.7)
MCV: 85 fL (ref 79–97)
Monocytes Absolute: 0.8 10*3/uL (ref 0.1–0.9)
Monocytes: 8 %
Neutrophils Absolute: 6.1 10*3/uL (ref 1.4–7.0)
Neutrophils: 62 %
Platelets: 441 10*3/uL (ref 150–450)
RBC: 5.35 x10E6/uL — ABNORMAL HIGH (ref 3.77–5.28)
RDW: 14.4 % (ref 11.7–15.4)
WBC: 9.8 10*3/uL (ref 3.4–10.8)

## 2019-09-14 LAB — TSH: TSH: 1.99 u[IU]/mL (ref 0.450–4.500)

## 2019-09-19 NOTE — Assessment & Plan Note (Signed)
Stable and under good control, continue current regimen with rare prn use of klonopin

## 2019-09-19 NOTE — Assessment & Plan Note (Signed)
Stable, well controlled. Continue current regimen

## 2019-09-23 ENCOUNTER — Ambulatory Visit
Admission: RE | Admit: 2019-09-23 | Discharge: 2019-09-23 | Disposition: A | Discharge status: Home / Self Care | Payer: Managed Care, Other (non HMO) | Source: Ambulatory Visit | Attending: Family Medicine | Admitting: Family Medicine

## 2019-09-23 DIAGNOSIS — Z1231 Encounter for screening mammogram for malignant neoplasm of breast: Secondary | ICD-10-CM | POA: Insufficient documentation

## 2019-09-23 IMAGING — MG DIGITAL SCREENING BILAT W/ TOMO W/ CAD
8 series · 8 of 24 positions shown · non-contrast
Comparison: Previous exam(s).

CLINICAL DATA: Screening.

EXAM:
DIGITAL SCREENING BILATERAL MAMMOGRAM WITH TOMO AND CAD

[R CC synth-2D]
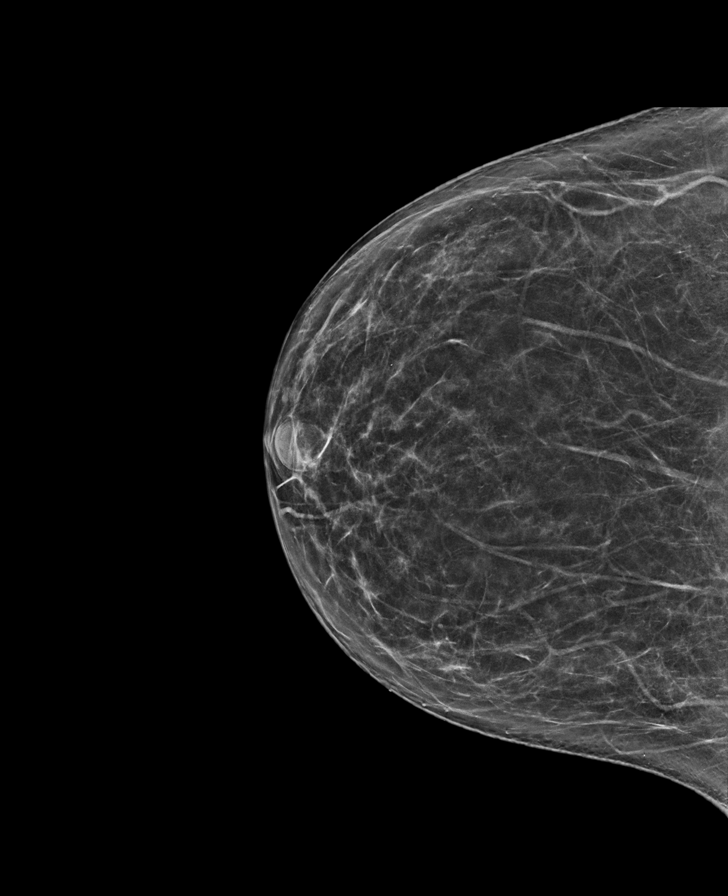

[R MLO synth-2D]
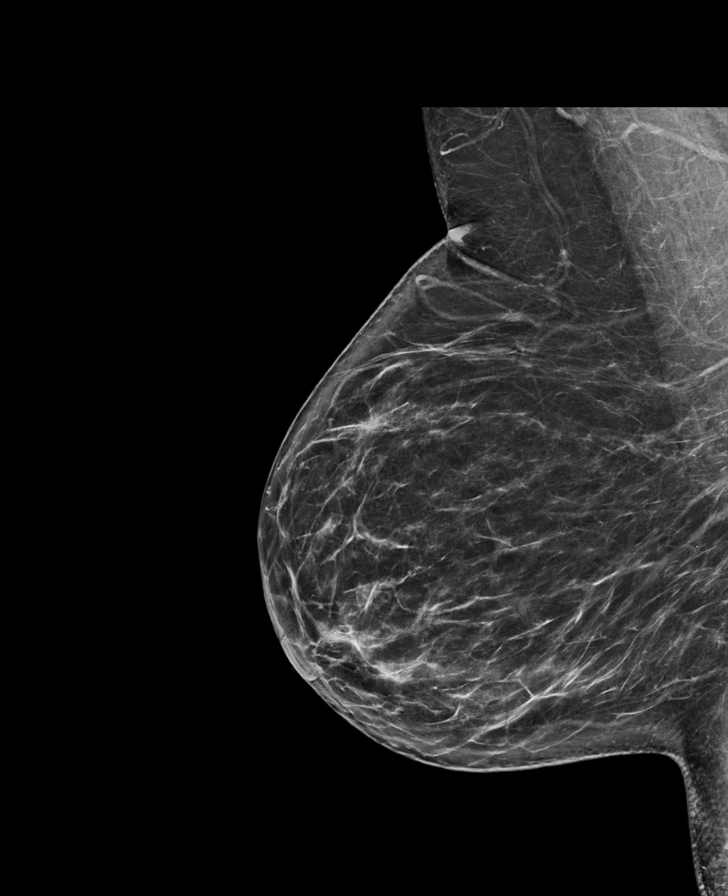

[L CC synth-2D]
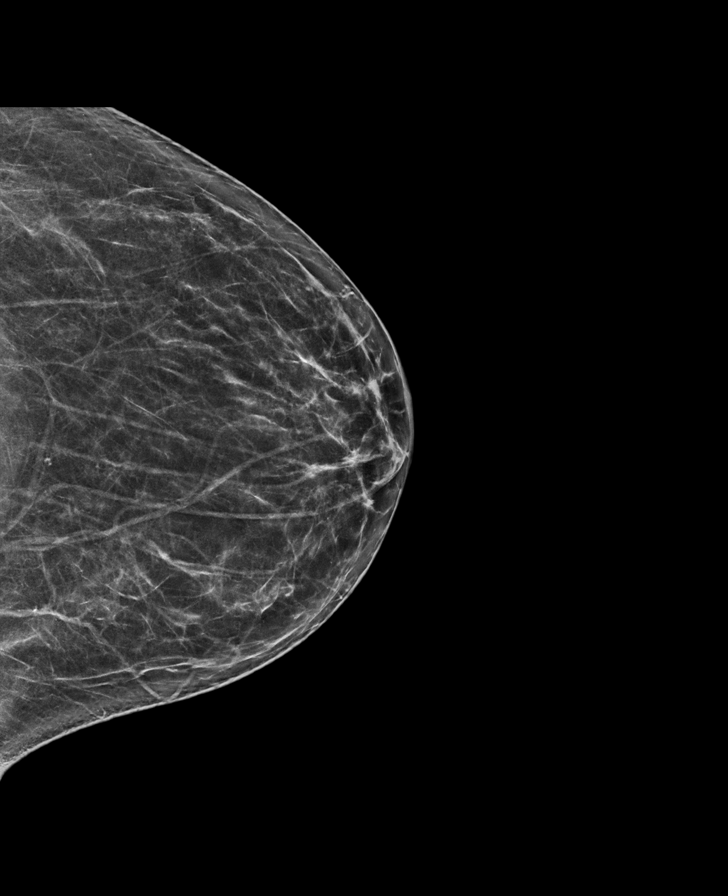

[L MLO synth-2D]
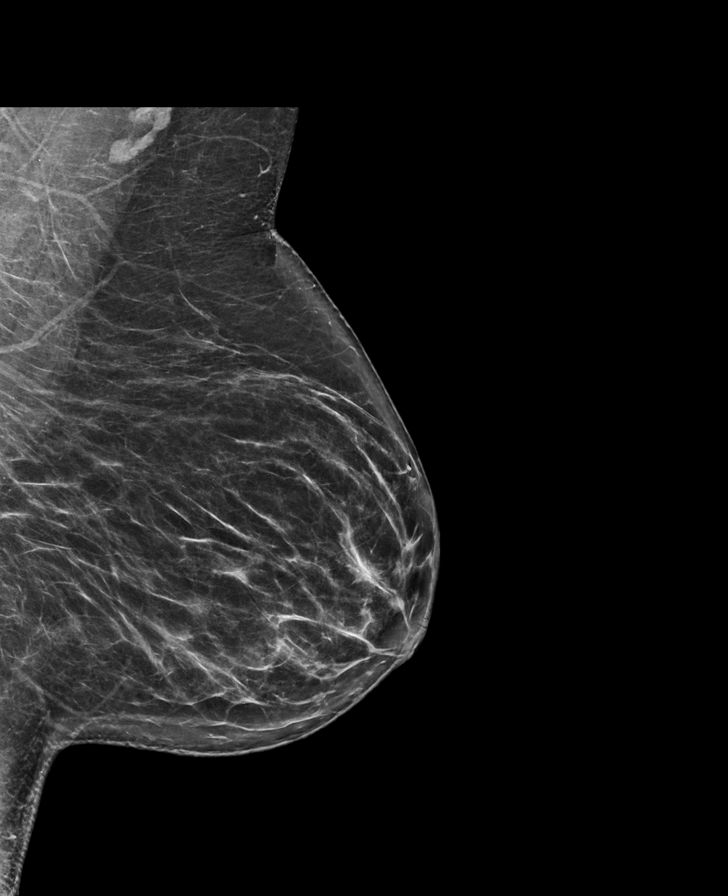

[L CC tomo · tomo slice 33/65.0]
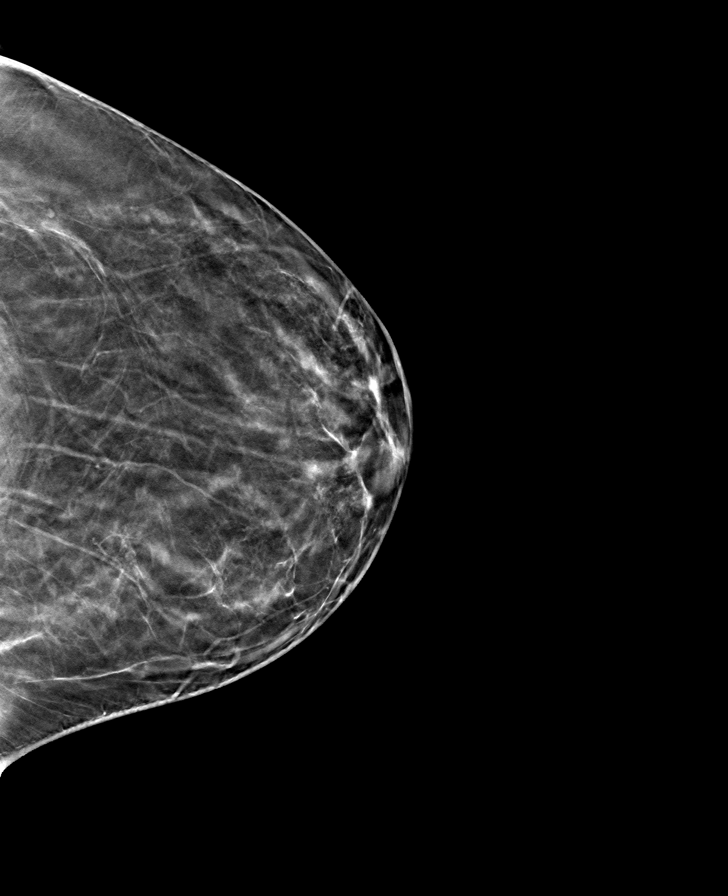

[R MLO tomo · tomo slice 40/79.0]
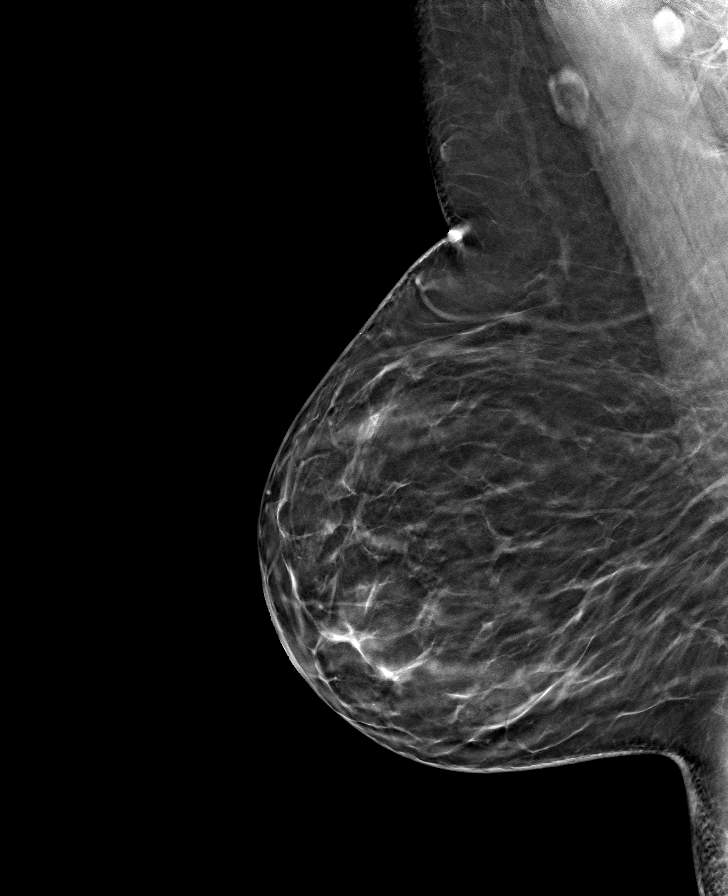

[L MLO tomo · tomo slice 39/77.0]
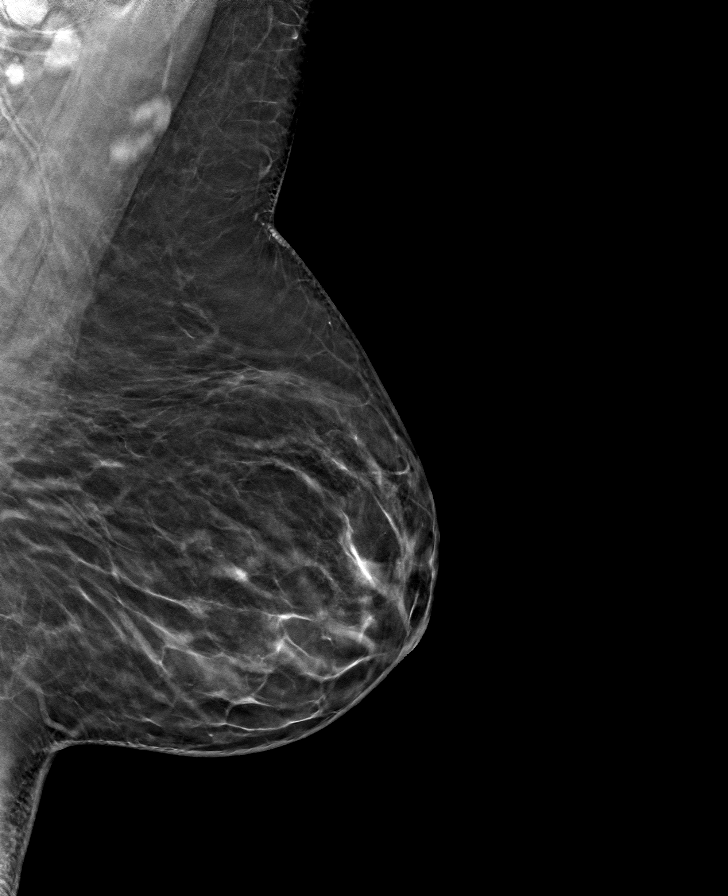

[R CC tomo · tomo slice 35/69.0]
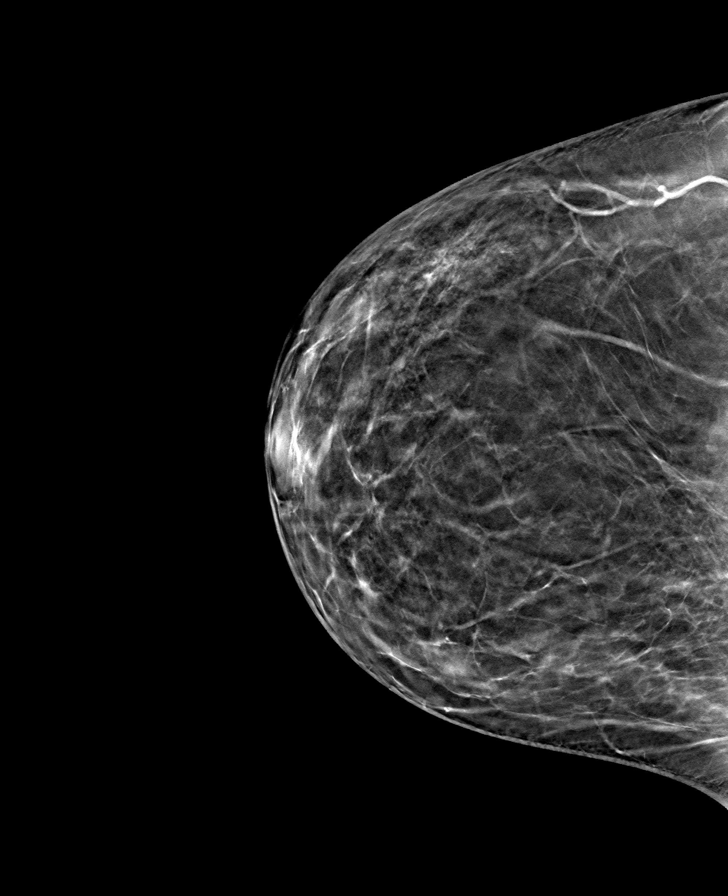

[8 of 24 positions shown; findings below may reference images not displayed]

ACR Breast Density Category b: There are scattered areas of
fibroglandular density.
FINDINGS: There are no findings suspicious for malignancy. Images were
processed with CAD.
IMPRESSION: No mammographic evidence of malignancy. A result letter of this
screening mammogram will be mailed directly to the patient.

RECOMMENDATION:
Screening mammogram in one year. (Code:[TQ])

BI-RADS CATEGORY  1: Negative.

## 2019-10-20 ENCOUNTER — Encounter: Payer: Self-pay | Admitting: Family Medicine

## 2019-11-08 ENCOUNTER — Encounter: Payer: Self-pay | Admitting: Family Medicine

## 2019-11-08 ENCOUNTER — Telehealth (INDEPENDENT_AMBULATORY_CARE_PROVIDER_SITE_OTHER): Payer: Managed Care, Other (non HMO) | Admitting: Family Medicine

## 2019-11-08 DIAGNOSIS — G4700 Insomnia, unspecified: Secondary | ICD-10-CM

## 2019-11-08 DIAGNOSIS — F411 Generalized anxiety disorder: Secondary | ICD-10-CM | POA: Diagnosis not present

## 2019-11-08 MED ORDER — TRAZODONE HCL 50 MG PO TABS: 25.0000 mg | ORAL_TABLET | Freq: Every evening | ORAL | 3 refills | Status: DC | PRN

## 2019-11-08 NOTE — Assessment & Plan Note (Signed)
Stable. Does not need refill of her klonopin- seems to be just insomnia she's having an issue with. Continue to monitor.

## 2019-11-08 NOTE — Progress Notes (Signed)
There were no vitals taken for this visit.   Subjective:    Patient ID: Heidi Taylor, female    DOB: 11-Oct-1970, 49 y.o.   MRN: [id number]  HPI: Heidi Taylor is a 49 y.o. female  Chief Complaint  Patient presents with  . Anxiety   ANXIETY/STRESS Duration:stable Anxious mood: no  Excessive worrying: no Irritability: no  Sweating: no Nausea: no Palpitations:no Hyperventilation: no Panic attacks: no Agoraphobia: no  Obscessions/compulsions: no Depressed mood: no Depression screen [facility] 2/9 11/08/2019 09/13/2019 02/01/2019 03/06/2018 09/02/2017  Decreased Interest 1 1 1 1 1   Down, Depressed, Hopeless 1 1 1 1 1   PHQ - 2 Score 2 2 2 2 2   Altered sleeping 3 2 0 1 1  Tired, decreased energy 1 1 0 1 1  Change in appetite 0 1 0 1 1  Feeling bad or failure about yourself  0 0 0 0 0  Trouble concentrating 0 0 0 0 0  Moving slowly or fidgety/restless 0 0 0 0 0  Suicidal thoughts 0 0 0 0 0  PHQ-9 Score 6 6 2 5 5   Difficult doing work/chores Somewhat difficult - Not difficult at all Somewhat difficult Not difficult at all   GAD 7 : Generalized Anxiety Score 11/08/2019 09/13/2019 09/02/2017 06/03/2017  Nervous, Anxious, on Edge 0 1 1 2   Control/stop worrying 0 0 0 1  Worry too much - different things 0 0 0 1  Trouble relaxing 0 1 1 1   Restless 0 0 0 0  Easily annoyed or irritable 0 0 1 1  Afraid - awful might happen 0 0 0 0  Total GAD 7 Score 0 2 3 6   Anxiety Difficulty Not difficult at all Very difficult Somewhat difficult Very difficult   Anhedonia: no Weight changes: no Insomnia: yes hard to stay asleep  Hypersomnia: no Fatigue/loss of energy: yes Feelings of worthlessness: no Feelings of guilt: no Impaired concentration/indecisiveness: no Suicidal ideations: no  Crying spells: no Recent Stressors/Life Changes: yes   Relationship problems: no   Family stress: yes     Financial stress: no    Job stress: no    Recent death/loss: no  INSOMNIA- has not been  sleeping well. Has been waking up consistently about 2-4AM Duration: a few weeks Satisfied with sleep quality: yes Difficulty falling asleep: no Difficulty staying asleep: yes Waking a few hours after sleep onset: yes Early morning awakenings: yes Daytime hypersomnolence: yes Wakes feeling refreshed: no Good sleep hygiene: yes Apnea: no Snoring: no Depressed/anxious mood: no Recent stress: yes Restless legs/nocturnal leg cramps: no Chronic pain/arthritis: no History of sleep study: no Treatments attempted: sleepy time tea, klonopin and melatonin    Relevant past medical, surgical, family and social history reviewed and updated as indicated. Interim medical history since our last visit reviewed. Allergies and medications reviewed and updated.  Review of Systems  Constitutional: Negative.   Respiratory: Negative.   Cardiovascular: Negative.   Gastrointestinal: Negative.   Musculoskeletal: Negative.   Psychiatric/Behavioral: Positive for sleep disturbance. Negative for agitation, behavioral problems, confusion, decreased concentration, dysphoric mood, hallucinations, self-injury and suicidal ideas. The patient is not nervous/anxious and is not hyperactive.     Per HPI unless specifically indicated above     Objective:    There were no vitals taken for this visit.  Wt Readings from Last 3 Encounters:  09/13/19 203 lb (92.1 kg)  02/14/19 212 lb (96.2 kg)  04/14/18 212 lb 3 oz (96.2 kg)    Physical Exam  Vitals and nursing note reviewed.  Constitutional:      General: She is not in acute distress.    Appearance: Normal appearance. She is not ill-appearing, toxic-appearing or diaphoretic.  HENT:     Head: Normocephalic and atraumatic.     Right Ear: External ear normal.     Left Ear: External ear normal.     Nose: Nose normal.     Mouth/Throat:     Mouth: Mucous membranes are moist.     Pharynx: Oropharynx is clear.  Eyes:     General: No scleral icterus.       Right  eye: No discharge.        Left eye: No discharge.     Conjunctiva/sclera: Conjunctivae normal.     Pupils: Pupils are equal, round, and reactive to light.  Pulmonary:     Effort: Pulmonary effort is normal. No respiratory distress.     Comments: Speaking in full sentences Musculoskeletal:        General: Normal range of motion.     Cervical back: Normal range of motion.  Skin:    Coloration: Skin is not jaundiced or pale.     Findings: No bruising, erythema, lesion or rash.  Neurological:     Mental Status: She is alert and oriented to person, place, and time. Mental status is at baseline.  Psychiatric:        Mood and Affect: Mood normal.        Behavior: Behavior normal.        Thought Content: Thought content normal.        Judgment: Judgment normal.     Results for orders placed or performed in visit on 09/13/19  CBC with Differential/Platelet out  Result Value Ref Range   WBC 9.8 3.4 - 10.8 x10E3/uL   RBC 5.35 (H) 3.77 - 5.28 x10E6/uL   Hemoglobin 14.1 11.1 - 15.9 g/dL   Hematocrit 45.3 34.0 - 46.6 %   MCV 85 79 - 97 fL   MCH 26.4 (L) 26.6 - 33.0 pg   MCHC 31.1 (L) 31.5 - 35.7 g/dL   RDW 14.4 11.7 - 15.4 %   Platelets 441 150 - 450 x10E3/uL   Neutrophils 62 Not Estab. %   Lymphs 25 Not Estab. %   Monocytes 8 Not Estab. %   Eos 4 Not Estab. %   Basos 1 Not Estab. %   Neutrophils Absolute 6.1 1.4 - 7.0 x10E3/uL   Lymphocytes Absolute 2.5 0.7 - 3.1 x10E3/uL   Monocytes Absolute 0.8 0.1 - 0.9 x10E3/uL   EOS (ABSOLUTE) 0.4 0.0 - 0.4 x10E3/uL   Basophils Absolute 0.1 0.0 - 0.2 x10E3/uL   Immature Granulocytes 0 Not Estab. %   Immature Grans (Abs) 0.0 0.0 - 0.1 x10E3/uL  Comprehensive metabolic panel  Result Value Ref Range   Glucose 87 65 - 99 mg/dL   BUN 11 6 - 24 mg/dL   Creatinine, Ser 0.99 0.57 - 1.00 mg/dL   GFR calc non Af Amer 68 >59 mL/min/1.73   GFR calc Af Amer 78 >59 mL/min/1.73   BUN/Creatinine Ratio 11 9 - 23   Sodium 142 134 - 144 mmol/L    Potassium 4.9 3.5 - 5.2 mmol/L   Chloride 106 96 - 106 mmol/L   CO2 18 (L) 20 - 29 mmol/L   Calcium 9.2 8.7 - 10.2 mg/dL   Total Protein 7.1 6.0 - 8.5 g/dL   Albumin 4.3 3.8 - 4.8 g/dL   Globulin, Total  2.8 1.5 - 4.5 g/dL   Albumin/Globulin Ratio 1.5 1.2 - 2.2   Bilirubin Total 0.3 0.0 - 1.2 mg/dL   Alkaline Phosphatase 69 39 - 117 IU/L   AST 17 0 - 40 IU/L   ALT 13 0 - 32 IU/L  Lipid Panel w/o Chol/HDL Ratio out  Result Value Ref Range   Cholesterol, Total 209 (H) 100 - 199 mg/dL   Triglycerides 102 0 - 149 mg/dL   HDL 47 >39 mg/dL   VLDL Cholesterol Cal 18 5 - 40 mg/dL   LDL Chol Calc (NIH) 144 (H) 0 - 99 mg/dL  TSH  Result Value Ref Range   TSH 1.990 0.450 - 4.500 uIU/mL  UA/M w/rflx Culture, Routine   Specimen: Urine   URINE  Result Value Ref Range   Specific Gravity, UA <1.005 (L) 1.005 - 1.030   pH, UA 5.5 5.0 - 7.5   Color, UA Yellow Yellow   Appearance Ur Clear Clear   Leukocytes,UA Negative Negative   Protein,UA Negative Negative/Trace   Glucose, UA Negative Negative   Ketones, UA Negative Negative   RBC, UA Negative Negative   Bilirubin, UA Negative Negative   Urobilinogen, Ur 0.2 0.2 - 1.0 mg/dL   Nitrite, UA Negative Negative      Assessment & Plan:   Problem List Items Addressed This Visit      Other   Anxiety disorder - Primary    Stable. Does not need refill of her klonopin- seems to be just insomnia she's having an issue with. Continue to monitor.       Relevant Medications   traZODone (DESYREL) 50 MG tablet    Other Visit Diagnoses    Insomnia, unspecified type       Will try Trazodone and see how she does. Rx sent to her pharmacy. Call with any concerns.        Follow up plan: Return June- 6 month follow up.    . This visit was completed via mychadtr due to the restrictions of the COVID-19 pandemic. All issues as above were discussed and addressed. Physical exam was done as above through visual confirmation on mychart. If it was felt  that the patient should be evaluated in the office, they were directed there. The patient verbally consented to this visit. . Location of the patient: home . Location of the provider: work . Those involved with this call:  . Provider: Park Liter, DO . CMA: Tiffany Reel, CMA . Front Desk/Registration: Don Perking  . Time spent on call: 15 minutes with patient face to face via video conference. More than 50% of this time was spent in counseling and coordination of care. 23 minutes total spent in review of patient's record and preparation of their chart.

## 2019-11-17 ENCOUNTER — Other Ambulatory Visit: Payer: Self-pay | Admitting: Family Medicine

## 2019-11-17 MED ORDER — CYCLOBENZAPRINE HCL 10 MG PO TABS: 10.0000 mg | ORAL_TABLET | Freq: Every day | ORAL | 0 refills | Status: DC

## 2019-11-17 NOTE — Telephone Encounter (Signed)
LOV: 11/08/2019, NOV: 03/13/2020 with Park Liter, DO

## 2019-12-16 ENCOUNTER — Ambulatory Visit: Payer: Managed Care, Other (non HMO) | Attending: Internal Medicine

## 2019-12-16 DIAGNOSIS — Z23 Encounter for immunization: Secondary | ICD-10-CM

## 2019-12-16 NOTE — Progress Notes (Signed)
   Covid-19 Vaccination Clinic  Name:  Heidi Taylor    MRN: [id number] DOB: Jan 22, 1971  12/16/2019  Ms. Heidi Taylor was observed post Covid-19 immunization for 15 minutes without incident. She was provided with Vaccine Information Sheet and instruction to access the V-Safe system.   Ms. Heidi Taylor was instructed to call 911 with any severe reactions post vaccine: Marland Kitchen Difficulty breathing  . Swelling of face and throat  . A fast heartbeat  . A bad rash all over body  . Dizziness and weakness   Immunizations Administered    Name Date Dose VIS Date Route   Pfizer COVID-19 Vaccine 12/16/2019 11:58 AM 0.3 mL 09/03/2019 Intramuscular   Manufacturer: [place]   Lot: [id number]   [place]: [id number]

## 2019-12-30 ENCOUNTER — Encounter: Payer: Self-pay | Admitting: Family Medicine

## 2020-01-11 ENCOUNTER — Ambulatory Visit: Payer: Managed Care, Other (non HMO) | Attending: Internal Medicine

## 2020-01-11 DIAGNOSIS — Z23 Encounter for immunization: Secondary | ICD-10-CM

## 2020-01-11 NOTE — Progress Notes (Signed)
   Covid-19 Vaccination Clinic  Name:  Heidi Taylor    MRN: [id number] DOB: Jul 08, 1971  01/11/2020  [name] was observed post Covid-19 immunization for 15 minutes without incident. She was provided with Vaccine Information Sheet and instruction to access the V-Safe system.   [name] was instructed to call 911 with any severe reactions post vaccine: Marland Kitchen Difficulty breathing  . Swelling of face and throat  . A fast heartbeat  . A bad rash all over body  . Dizziness and weakness   Immunizations Administered    Name Date Dose VIS Date Route   Pfizer COVID-19 Vaccine 01/11/2020  3:36 PM 0.3 mL 11/17/2018 Intramuscular   Manufacturer: [place]   Lot: [id number]   [place]: [id number]

## 2020-02-16 ENCOUNTER — Other Ambulatory Visit: Payer: Self-pay

## 2020-02-16 MED ORDER — NAPROXEN 500 MG PO TABS: 500.0000 mg | ORAL_TABLET | Freq: Two times a day (BID) | ORAL | 1 refills | Status: DC

## 2020-02-16 MED ORDER — TRAZODONE HCL 50 MG PO TABS: 25.0000 mg | ORAL_TABLET | Freq: Every evening | ORAL | 3 refills | Status: DC | PRN

## 2020-02-16 NOTE — Telephone Encounter (Signed)
Now using [facility], needs new prescriptions sent there please.

## 2020-02-24 ENCOUNTER — Other Ambulatory Visit: Payer: Self-pay | Admitting: Family Medicine

## 2020-03-11 ENCOUNTER — Other Ambulatory Visit: Payer: Self-pay | Admitting: Family Medicine

## 2020-03-11 NOTE — Telephone Encounter (Signed)
Requested Prescriptions  Pending Prescriptions Disp Refills  . tolterodine (DETROL LA) 2 MG 24 hr capsule [Pharmacy Med Name: TOLTERODINE  2MG  CAP  EXTENDED RELEASE] 90 capsule 3    Sig: TAKE 1 CAPSULE BY MOUTH  DAILY     Urology:  Bladder Agents Passed - 03/11/2020 11:12 PM      Passed - Valid encounter within last 12 months    Recent Outpatient Visits          4 months ago Generalized anxiety disorder   [place], [name], DO   6 months ago Generalized anxiety disorder   [place], Lilia Argue, [place]   1 year ago Generalized anxiety disorder   [place], [place], DO   1 year ago Nightmares   [place], [place], DO   2 years ago Routine general medical examination at a health care facility   [place], [place], DO      Future Appointments            In 2 days Wynetta Emery, [name], DO [place], [facility]           . prazosin (MINIPRESS) 1 MG capsule [Pharmacy Med Name: PRAZOSIN HYDROCHLORIDE  1MG  CAP] 90 capsule 3    Sig: TAKE 1 CAPSULE BY MOUTH AT  BEDTIME     Cardiovascular:  Alpha Blockers Passed - 03/11/2020 11:12 PM      Passed - Last BP in normal range    BP Readings from Last 1 Encounters:  09/13/19 131/87         Passed - Valid encounter within last 6 months    Recent Outpatient Visits          4 months ago Generalized anxiety disorder   [place], [place], DO   6 months ago Generalized anxiety disorder   [facility] Volney American, [place]   1 year ago Generalized anxiety disorder   [place], [place], DO   1 year ago Nightmares   [place] P, DO   2 years ago Routine general medical examination at a health care facility   [place], Barb Merino, DO      Future Appointments            In 2 days Wynetta Emery, Barb Merino, DO  [facility], [place]

## 2020-03-13 ENCOUNTER — Ambulatory Visit: Payer: Managed Care, Other (non HMO) | Admitting: Family Medicine

## 2020-03-16 ENCOUNTER — Encounter: Payer: Self-pay | Admitting: Family Medicine

## 2020-03-16 ENCOUNTER — Ambulatory Visit: Payer: Managed Care, Other (non HMO) | Admitting: Family Medicine

## 2020-04-04 ENCOUNTER — Other Ambulatory Visit: Payer: Self-pay | Admitting: Family Medicine

## 2020-04-04 NOTE — Telephone Encounter (Signed)
Requested Prescriptions  Pending Prescriptions Disp Refills  . traZODone (DESYREL) 50 MG tablet [Pharmacy Med Name: TRAZODONE  50MG  TAB] 90 tablet 0    Sig: TAKE 1/2 TO 1 TABLET BY  MOUTH AT BEDTIME AS NEEDED  FOR SLEEP     Psychiatry: Antidepressants - Serotonin Modulator Passed - 04/04/2020 10:25 PM      Passed - Valid encounter within last 6 months    Recent Outpatient Visits          4 months ago Generalized anxiety disorder   [place], Frannie, DO   6 months ago Generalized anxiety disorder   [facility] Volney American, [place]   1 year ago Generalized anxiety disorder   [place], [place], DO   1 year ago Nightmares   [place], [place], DO   2 years ago Routine general medical examination at a health care facility   [place], Barb Merino, DO      Future Appointments            In 2 weeks Wynetta Emery, Barb Merino, DO [facility], [facility]

## 2020-04-19 ENCOUNTER — Ambulatory Visit (INDEPENDENT_AMBULATORY_CARE_PROVIDER_SITE_OTHER): Payer: Managed Care, Other (non HMO) | Admitting: Family Medicine

## 2020-04-19 ENCOUNTER — Other Ambulatory Visit: Payer: Self-pay

## 2020-04-19 ENCOUNTER — Encounter: Payer: Self-pay | Admitting: Family Medicine

## 2020-04-19 VITALS — BP 126/83 | HR 77 | Temp 98.8°F | Ht 64.17 in | Wt 193.4 lb

## 2020-04-19 DIAGNOSIS — Z1211 Encounter for screening for malignant neoplasm of colon: Secondary | ICD-10-CM | POA: Diagnosis not present

## 2020-04-19 DIAGNOSIS — Z0000 Encounter for general adult medical examination without abnormal findings: Secondary | ICD-10-CM | POA: Diagnosis not present

## 2020-04-19 DIAGNOSIS — F411 Generalized anxiety disorder: Secondary | ICD-10-CM

## 2020-04-19 DIAGNOSIS — G4700 Insomnia, unspecified: Secondary | ICD-10-CM

## 2020-04-19 DIAGNOSIS — Z1231 Encounter for screening mammogram for malignant neoplasm of breast: Secondary | ICD-10-CM

## 2020-04-19 LAB — URINALYSIS, ROUTINE W REFLEX MICROSCOPIC
Bilirubin, UA: NEGATIVE
Glucose, UA: NEGATIVE
Ketones, UA: NEGATIVE
Leukocytes,UA: NEGATIVE
Nitrite, UA: NEGATIVE
Protein,UA: NEGATIVE
RBC, UA: NEGATIVE
Specific Gravity, UA: 1.015 (ref 1.005–1.030)
Urobilinogen, Ur: 0.2 mg/dL (ref 0.2–1.0)
pH, UA: 5.5 (ref 5.0–7.5)

## 2020-04-19 LAB — BAYER DCA HB A1C WAIVED: HB A1C (BAYER DCA - WAIVED): 5.2 % (ref <7.0)

## 2020-04-19 MED ORDER — TRAZODONE HCL 50 MG PO TABS: 25.0000 mg | ORAL_TABLET | Freq: Every evening | ORAL | 3 refills | Status: DC | PRN

## 2020-04-19 MED ORDER — TOLTERODINE TARTRATE ER 2 MG PO CP24: 2.0000 mg | ORAL_CAPSULE | Freq: Every day | ORAL | 1 refills | Status: DC

## 2020-04-19 NOTE — Assessment & Plan Note (Signed)
Doing much better. Only using klonopin occasionally. Does not need Rx right now. Continue to monitor.

## 2020-04-19 NOTE — Progress Notes (Signed)
BP 126/83 (BP Location: Left Arm, Patient Position: Sitting, Cuff Size: Large)   Pulse 77   Temp 98.8 F (37.1 C) (Oral)   Ht 5' 4.17" (1.63 m)   Wt 193 lb 6.4 oz (87.7 kg)   LMP 01/23/2020 (Exact Date)   SpO2 100%   BMI 33.02 kg/m    Subjective:    Patient ID: Heidi Taylor, female    DOB: Dec 24, 1970, 49 y.o.   MRN: [id number]  HPI: Heidi Taylor is a 49 y.o. female presenting on 04/19/2020 for comprehensive medical examination. Current medical complaints include:  INSOMNIA- trazodone seems to be helping, sometimes making her really groggy and sometimes doesn't Duration: chronic Satisfied with sleep quality: yes Difficulty falling asleep: no Difficulty staying asleep: no Waking a few hours after sleep onset: no Early morning awakenings: no Daytime hypersomnolence: yes Wakes feeling refreshed: sometimes Good sleep hygiene: yes Apnea: no Snoring: no Depressed/anxious mood: no Recent stress: no Restless legs/nocturnal leg cramps: no Chronic pain/arthritis: no History of sleep study: no Treatments attempted: trazodone, melatonin, uinsom and benadryl   ANXIETY/STRESS- needing the klonopin very occasionally Duration: chronic Status:better Anxious mood: no  Excessive worrying: no Irritability: no  Sweating: no Nausea: no Palpitations:no Hyperventilation: no Panic attacks: no Agoraphobia: no  Obscessions/compulsions: no Depressed mood: no Depression screen [facility] 2/9 04/19/2020 11/08/2019 09/13/2019 02/01/2019 03/06/2018  Decreased Interest 1 1 1 1 1   Down, Depressed, Hopeless 1 1 1 1 1   PHQ - 2 Score 2 2 2 2 2   Altered sleeping 2 3 2  0 1  Tired, decreased energy 2 1 1  0 1  Change in appetite 0 0 1 0 1  Feeling bad or failure about yourself  0 0 0 0 0  Trouble concentrating 0 0 0 0 0  Moving slowly or fidgety/restless 0 0 0 0 0  Suicidal thoughts 0 0 0 0 0  PHQ-9 Score 6 6 6 2 5   Difficult doing work/chores Somewhat difficult Somewhat difficult - Not difficult at  all Somewhat difficult   Anhedonia: no Weight changes: no Insomnia: yes hard to fall asleep  Hypersomnia: no Fatigue/loss of energy: yes Feelings of worthlessness: no Feelings of guilt: no Impaired concentration/indecisiveness: no Suicidal ideations: no  Crying spells: no Recent Stressors/Life Changes: no   Relationship problems: no   Family stress: no     Financial stress: no    Job stress: no    Recent death/loss: no   She currently lives with: husband Menopausal Symptoms: yes- hot flashes  Depression Screen done today and results listed below:  Depression screen [facility] 2/9 04/19/2020 11/08/2019 09/13/2019 02/01/2019 03/06/2018  Decreased Interest 1 1 1 1 1   Down, Depressed, Hopeless 1 1 1 1 1   PHQ - 2 Score 2 2 2 2 2   Altered sleeping 2 3 2  0 1  Tired, decreased energy 2 1 1  0 1  Change in appetite 0 0 1 0 1  Feeling bad or failure about yourself  0 0 0 0 0  Trouble concentrating 0 0 0 0 0  Moving slowly or fidgety/restless 0 0 0 0 0  Suicidal thoughts 0 0 0 0 0  PHQ-9 Score 6 6 6 2 5   Difficult doing work/chores Somewhat difficult Somewhat difficult - Not difficult at all Somewhat difficult     Past Medical History:  Past Medical History:  Diagnosis Date  . Anxiety   . DDD (degenerative disc disease), lumbar     Surgical History:  Past Surgical History:  Procedure  Laterality Date  . HERNIA REPAIR      Medications:  Current Outpatient Medications on File Prior to Visit  Medication Sig  . clonazePAM (KLONOPIN) 0.5 MG tablet Take 0.5-1 tablets (0.25-0.5 mg total) by mouth 2 (two) times daily as needed for anxiety.  . cyclobenzaprine (FLEXERIL) 10 MG tablet Take 1 tablet (10 mg total) by mouth at bedtime.  . fluticasone (FLONASE) 50 MCG/ACT nasal spray Place into the nose.  . gabapentin (NEURONTIN) 300 MG capsule Take by mouth as needed.   . naproxen (NAPROSYN) 500 MG tablet Take 1 tablet (500 mg total) by mouth 2 (two) times daily with a meal.   No current  facility-administered medications on file prior to visit.    Allergies:  Allergies  Allergen Reactions  . Duloxetine Other (See Comments)    GI and weight  . Sertraline Other (See Comments)    GI and weight  . Trintellix [Vortioxetine] Nausea And Vomiting    Social History:  Social History   Socioeconomic History  . Marital status: Married    Spouse name: Not on file  . Number of children: Not on file  . Years of education: Not on file  . Highest education level: Not on file  Occupational History  . Not on file  Tobacco Use  . Smoking status: Former Smoker    Quit date: 09/06/2009    Years since quitting: 10.6  . Smokeless tobacco: Never Used  Vaping Use  . Vaping Use: Never used  Substance and Sexual Activity  . Alcohol use: Yes    Comment: On occasion  . Drug use: No  . Sexual activity: Yes    Birth control/protection: None  Other Topics Concern  . Not on file  Social History Narrative  . Not on file   Social Determinants of Health   Financial Resource Strain:   . Difficulty of Paying Living Expenses:   Food Insecurity:   . Worried About Charity fundraiser in the Last Year:   . Arboriculturist in the Last Year:   Transportation Needs:   . Film/video editor (Medical):   Marland Kitchen Lack of Transportation (Non-Medical):   Physical Activity:   . Days of Exercise per Week:   . Minutes of Exercise per Session:   Stress:   . Feeling of Stress :   Social Connections:   . Frequency of Communication with Friends and Family:   . Frequency of Social Gatherings with Friends and Family:   . Attends Religious Services:   . Active Member of Clubs or Organizations:   . Attends Archivist Meetings:   Marland Kitchen Marital Status:   Intimate Partner Violence:   . Fear of Current or Ex-Partner:   . Emotionally Abused:   Marland Kitchen Physically Abused:   . Sexually Abused:    Social History   Tobacco Use  Smoking Status Former Smoker  . Quit date: 09/06/2009  . Years since  quitting: 10.6  Smokeless Tobacco Never Used   Social History   Substance and Sexual Activity  Alcohol Use Yes   Comment: On occasion    Family History:  Family History  Problem Relation Age of Onset  . Cancer Mother        Brain  . Parkinson's disease Father   . Parkinson's disease Paternal Grandfather   . Breast cancer Maternal Aunt     Past medical history, surgical history, medications, allergies, family history and social history reviewed with patient today and changes  made to appropriate areas of the chart.   Review of Systems  Constitutional: Negative.   HENT: Positive for hearing loss. Negative for congestion, ear discharge, ear pain, nosebleeds, sinus pain, sore throat and tinnitus.   Eyes: Negative.   Respiratory: Negative.  Negative for stridor.   Cardiovascular: Negative.   Gastrointestinal: Negative.   Genitourinary: Negative.   Musculoskeletal: Positive for back pain. Negative for falls, joint pain, myalgias and neck pain.  Skin: Negative.   Neurological: Negative.   Endo/Heme/Allergies: Positive for environmental allergies. Negative for polydipsia. Does not bruise/bleed easily.  Psychiatric/Behavioral: Negative for depression, hallucinations, memory loss, substance abuse and suicidal ideas. The patient has insomnia. The patient is not nervous/anxious.     All other ROS negative except what is listed above and in the HPI.      Objective:    BP 126/83 (BP Location: Left Arm, Patient Position: Sitting, Cuff Size: Large)   Pulse 77   Temp 98.8 F (37.1 C) (Oral)   Ht 5' 4.17" (1.63 m)   Wt 193 lb 6.4 oz (87.7 kg)   LMP 01/23/2020 (Exact Date)   SpO2 100%   BMI 33.02 kg/m   Wt Readings from Last 3 Encounters:  04/19/20 193 lb 6.4 oz (87.7 kg)  09/13/19 203 lb (92.1 kg)  02/14/19 212 lb (96.2 kg)    Physical Exam Vitals and nursing note reviewed.  Constitutional:      General: She is not in acute distress.    Appearance: Normal appearance. She is  not ill-appearing, toxic-appearing or diaphoretic.  HENT:     Head: Normocephalic and atraumatic.     Right Ear: Tympanic membrane, ear canal and external ear normal. There is no impacted cerumen.     Left Ear: Tympanic membrane, ear canal and external ear normal. There is no impacted cerumen.     Nose: Nose normal. No congestion or rhinorrhea.     Mouth/Throat:     Mouth: Mucous membranes are moist.     Pharynx: Oropharynx is clear. No oropharyngeal exudate or posterior oropharyngeal erythema.  Eyes:     General: No scleral icterus.       Right eye: No discharge.        Left eye: No discharge.     Extraocular Movements: Extraocular movements intact.     Conjunctiva/sclera: Conjunctivae normal.     Pupils: Pupils are equal, round, and reactive to light.  Neck:     Vascular: No carotid bruit.  Cardiovascular:     Rate and Rhythm: Normal rate and regular rhythm.     Pulses: Normal pulses.     Heart sounds: No murmur heard.  No friction rub. No gallop.   Pulmonary:     Effort: Pulmonary effort is normal. No respiratory distress.     Breath sounds: Normal breath sounds. No stridor. No wheezing, rhonchi or rales.  Chest:     Chest wall: No tenderness.  Abdominal:     General: Abdomen is flat. Bowel sounds are normal. There is no distension.     Palpations: Abdomen is soft. There is no mass.     Tenderness: There is no abdominal tenderness. There is no right CVA tenderness, left CVA tenderness, guarding or rebound.     Hernia: No hernia is present.  Genitourinary:    Comments: Breast and pelvic exams deferred with shared decision making Musculoskeletal:        General: No swelling, tenderness, deformity or signs of injury.     Cervical back:  Normal range of motion and neck supple. No rigidity. No muscular tenderness.     Right lower leg: No edema.     Left lower leg: No edema.  Lymphadenopathy:     Cervical: No cervical adenopathy.  Skin:    General: Skin is warm and dry.      Capillary Refill: Capillary refill takes less than 2 seconds.     Coloration: Skin is not jaundiced or pale.     Findings: No bruising, erythema, lesion or rash.  Neurological:     General: No focal deficit present.     Mental Status: She is alert and oriented to person, place, and time. Mental status is at baseline.     Cranial Nerves: No cranial nerve deficit.     Sensory: No sensory deficit.     Motor: No weakness.     Coordination: Coordination normal.     Gait: Gait normal.     Deep Tendon Reflexes: Reflexes normal.  Psychiatric:        Mood and Affect: Mood normal.        Behavior: Behavior normal.        Thought Content: Thought content normal.        Judgment: Judgment normal.     Results for orders placed or performed in visit on 09/13/19  CBC with Differential/Platelet out  Result Value Ref Range   WBC 9.8 3.4 - 10.8 x10E3/uL   RBC 5.35 (H) 3.77 - 5.28 x10E6/uL   Hemoglobin 14.1 11.1 - 15.9 g/dL   Hematocrit 45.3 34.0 - 46.6 %   MCV 85 79 - 97 fL   MCH 26.4 (L) 26.6 - 33.0 pg   MCHC 31.1 (L) 31 - 35 g/dL   RDW 14.4 11.7 - 15.4 %   Platelets 441 150 - 450 x10E3/uL   Neutrophils 62 Not Estab. %   Lymphs 25 Not Estab. %   Monocytes 8 Not Estab. %   Eos 4 Not Estab. %   Basos 1 Not Estab. %   Neutrophils Absolute 6.1 1 - 7 x10E3/uL   Lymphocytes Absolute 2.5 0 - 3 x10E3/uL   Monocytes Absolute 0.8 0 - 0 x10E3/uL   EOS (ABSOLUTE) 0.4 0.0 - 0.4 x10E3/uL   Basophils Absolute 0.1 0 - 0 x10E3/uL   Immature Granulocytes 0 Not Estab. %   Immature Grans (Abs) 0.0 0.0 - 0.1 x10E3/uL  Comprehensive metabolic panel  Result Value Ref Range   Glucose 87 65 - 99 mg/dL   BUN 11 6 - 24 mg/dL   Creatinine, Ser 0.99 0.57 - 1.00 mg/dL   GFR calc non Af Amer 68 >59 mL/min/1.73   GFR calc Af Amer 78 >59 mL/min/1.73   BUN/Creatinine Ratio 11 9 - 23   Sodium 142 134 - 144 mmol/L   Potassium 4.9 3.5 - 5.2 mmol/L   Chloride 106 96 - 106 mmol/L   CO2 18 (L) 20 - 29 mmol/L    Calcium 9.2 8.7 - 10.2 mg/dL   Total Protein 7.1 6.0 - 8.5 g/dL   Albumin 4.3 3.8 - 4.8 g/dL   Globulin, Total 2.8 1.5 - 4.5 g/dL   Albumin/Globulin Ratio 1.5 1.2 - 2.2   Bilirubin Total 0.3 0.0 - 1.2 mg/dL   Alkaline Phosphatase 69 39 - 117 IU/L   AST 17 0 - 40 IU/L   ALT 13 0 - 32 IU/L  Lipid Panel w/o Chol/HDL Ratio out  Result Value Ref Range   Cholesterol, Total 209 (H) 100 -  199 mg/dL   Triglycerides 102 0 - 149 mg/dL   HDL 47 >39 mg/dL   VLDL Cholesterol Cal 18 5 - 40 mg/dL   LDL Chol Calc (NIH) 144 (H) 0 - 99 mg/dL  TSH  Result Value Ref Range   TSH 1.990 0.450 - 4.500 uIU/mL  UA/M w/rflx Culture, Routine   Specimen: Urine   URINE  Result Value Ref Range   Specific Gravity, UA <1.005 (L) 1.005 - 1.030   pH, UA 5.5 5.0 - 7.5   Color, UA Yellow Yellow   Appearance Ur Clear Clear   Leukocytes,UA Negative Negative   Protein,UA Negative Negative/Trace   Glucose, UA Negative Negative   Ketones, UA Negative Negative   RBC, UA Negative Negative   Bilirubin, UA Negative Negative   Urobilinogen, Ur 0.2 0.2 - 1.0 mg/dL   Nitrite, UA Negative Negative      Assessment & Plan:   Problem List Items Addressed This Visit      Other   Anxiety disorder    Doing much better. Only using klonopin occasionally. Does not need Rx right now. Continue to monitor.       Relevant Medications   traZODone (DESYREL) 50 MG tablet    Other Visit Diagnoses    Routine general medical examination at a health care facility    -  Primary   Vaccines up to date. Screening labs checked today. Pap up to date. Mammogram and colonoscopy ordered today. Continue diet and exercise. Call with concerns.    Relevant Orders   SARS-CoV-2 Semi-Quantitative Total Antibody, Spike   CBC with Differential/Platelet   Bayer DCA Hb A1c Waived   Comprehensive metabolic panel   Lipid Panel w/o Chol/HDL Ratio   TSH   Urinalysis, Routine w reflex microscopic   Hepatitis C Antibody   Encounter for screening  mammogram for malignant neoplasm of breast       Mammogram ordered today.   Relevant Orders   MM 3D SCREEN BREAST BILATERAL   Screening for colon cancer       Referral to GI placed today   Relevant Orders   Cologuard   Insomnia, unspecified type       Continue PRN trazodone. Continue to monitor. Call with any concerns.        Follow up plan: Return in about 1 year (around 04/19/2021), or Physical.   LABORATORY TESTING:  - Pap smear: up to date  IMMUNIZATIONS:   - Tdap: Tetanus vaccination status reviewed: last tetanus booster within 10 years. - Influenza: Postponed to flu season - COVID: Up to date  SCREENING: -Mammogram: Up to date  - Colonoscopy: Up to date   PATIENT COUNSELING:   Advised to take 1 mg of folate supplement per day if capable of pregnancy.   Sexuality: Discussed sexually transmitted diseases, partner selection, use of condoms, avoidance of unintended pregnancy  and contraceptive alternatives.   Advised to avoid cigarette smoking.  I discussed with the patient that most people either abstain from alcohol or drink within safe limits (<=14/week and <=4 drinks/occasion for males, <=7/weeks and <= 3 drinks/occasion for females) and that the risk for alcohol disorders and other health effects rises proportionally with the number of drinks per week and how often a drinker exceeds daily limits.  Discussed cessation/primary prevention of drug use and availability of treatment for abuse.   Diet: Encouraged to adjust caloric intake to maintain  or achieve ideal body weight, to reduce intake of dietary saturated fat and total  fat, to limit sodium intake by avoiding high sodium foods and not adding table salt, and to maintain adequate dietary potassium and calcium preferably from fresh fruits, vegetables, and low-fat dairy products.    stressed the importance of regular exercise  Injury prevention: Discussed safety belts, safety helmets, smoke detector, smoking near  bedding or upholstery.   Dental health: Discussed importance of regular tooth brushing, flossing, and dental visits.    NEXT PREVENTATIVE PHYSICAL DUE IN 1 YEAR. Return in about 1 year (around 04/19/2021), or Physical.

## 2020-04-19 NOTE — Patient Instructions (Addendum)
Call to schedule your mammogram  [facility] at [facility]  Address: [address], [place], [place] [address]  Phone: [phone]   Health Maintenance, Female Adopting a healthy lifestyle and getting preventive care are important in promoting health and wellness. Ask your health care provider about:  The right schedule for you to have regular tests and exams.  Things you can do on your own to prevent diseases and keep yourself healthy. What should I know about diet, weight, and exercise? Eat a healthy diet   Eat a diet that includes plenty of vegetables, fruits, low-fat dairy products, and lean protein.  Do not eat a lot of foods that are high in solid fats, added sugars, or sodium. Maintain a healthy weight Body mass index (BMI) is used to identify weight problems. It estimates body fat based on height and weight. Your health care provider can help determine your BMI and help you achieve or maintain a healthy weight. Get regular exercise Get regular exercise. This is one of the most important things you can do for your health. Most adults should:  Exercise for at least 150 minutes each week. The exercise should increase your heart rate and make you sweat (moderate-intensity exercise).  Do strengthening exercises at least twice a week. This is in addition to the moderate-intensity exercise.  Spend less time sitting. Even light physical activity can be beneficial. Watch cholesterol and blood lipids Have your blood tested for lipids and cholesterol at 48 years of age, then have this test every 5 years. Have your cholesterol levels checked more often if:  Your lipid or cholesterol levels are high.  You are older than 48 years of age.  You are at high risk for heart disease. What should I know about cancer screening? Depending on your health history and family history, you may need to have cancer screening at various ages. This may include screening  for:  Breast cancer.  Cervical cancer.  Colorectal cancer.  Skin cancer.  Lung cancer. What should I know about heart disease, diabetes, and high blood pressure? Blood pressure and heart disease  High blood pressure causes heart disease and increases the risk of stroke. This is more likely to develop in people who have high blood pressure readings, are of African descent, or are overweight.  Have your blood pressure checked: ? Every 3-5 years if you are 47-77 years of age. ? Every year if you are 71 years old or older. Diabetes Have regular diabetes screenings. This checks your fasting blood sugar level. Have the screening done:  Once every three years after age 58 if you are at a normal weight and have a low risk for diabetes.  More often and at a younger age if you are overweight or have a high risk for diabetes. What should I know about preventing infection? Hepatitis B If you have a higher risk for hepatitis B, you should be screened for this virus. Talk with your health care provider to find out if you are at risk for hepatitis B infection. Hepatitis C Testing is recommended for:  Everyone born from [birth year] through 1964.  Anyone with known risk factors for hepatitis C. Sexually transmitted infections (STIs)  Get screened for STIs, including gonorrhea and chlamydia, if: ? You are sexually active and are younger than 48 years of age. ? You are older than 48 years of age and your health care provider tells you that you are at risk for this type of  infection. ? Your sexual activity has changed since you were last screened, and you are at increased risk for chlamydia or gonorrhea. Ask your health care provider if you are at risk.  Ask your health care provider about whether you are at high risk for HIV. Your health care provider may recommend a prescription medicine to help prevent HIV infection. If you choose to take medicine to prevent HIV, you should first get tested for HIV.  You should then be tested every 3 months for as long as you are taking the medicine. Pregnancy  If you are about to stop having your period (premenopausal) and you may become pregnant, seek counseling before you get pregnant.  Take 400 to 800 micrograms (mcg) of folic acid every day if you become pregnant.  Ask for birth control (contraception) if you want to prevent pregnancy. Osteoporosis and menopause Osteoporosis is a disease in which the bones lose minerals and strength with aging. This can result in bone fractures. If you are 65 years old or older, or if you are at risk for osteoporosis and fractures, ask your health care provider if you should:  Be screened for bone loss.  Take a calcium or vitamin D supplement to lower your risk of fractures.  Be given hormone replacement therapy (HRT) to treat symptoms of menopause. Follow these instructions at home: Lifestyle  Do not use any products that contain nicotine or tobacco, such as cigarettes, e-cigarettes, and chewing tobacco. If you need help quitting, ask your health care provider.  Do not use street drugs.  Do not share needles.  Ask your health care provider for help if you need support or information about quitting drugs. Alcohol use  Do not drink alcohol if: ? Your health care provider tells you not to drink. ? You are pregnant, may be pregnant, or are planning to become pregnant.  If you drink alcohol: ? Limit how much you use to 0-1 drink a day. ? Limit intake if you are breastfeeding.  Be aware of how much alcohol is in your drink. In the [place] one drink equals one 12 oz bottle of beer (355 mL), one 5 oz glass of wine (148 mL), or one 1 oz glass of hard liquor (44 mL). General instructions  Schedule regular health, dental, and eye exams.  Stay current with your vaccines.  Tell your health care provider if: ? You often feel depressed. ? You have ever been abused or do not feel safe at  home. Summary  Adopting a healthy lifestyle and getting preventive care are important in promoting health and wellness.  Follow your health care provider's instructions about healthy diet, exercising, and getting tested or screened for diseases.  Follow your health care provider's instructions on monitoring your cholesterol and blood pressure. This information is not intended to replace advice given to you by your health care provider. Make sure you discuss any questions you have with your health care provider. Document Revised: 09/02/2018 Document Reviewed: 09/02/2018 Elsevier Patient Education  2020 [employer].

## 2020-04-20 LAB — LIPID PANEL W/O CHOL/HDL RATIO
Cholesterol, Total: 190 mg/dL (ref 100–199)
HDL: 48 mg/dL (ref >39)
LDL Chol Calc (NIH): 129 mg/dL — ABNORMAL HIGH (ref 0–99)
Triglycerides: 69 mg/dL (ref 0–149)
VLDL Cholesterol Cal: 13 mg/dL (ref 5–40)

## 2020-04-20 LAB — CBC WITH DIFFERENTIAL/PLATELET
Basophils Absolute: 0.1 10*3/uL (ref 0.0–0.2)
Basos: 1 %
EOS (ABSOLUTE): 0.4 10*3/uL (ref 0.0–0.4)
Eos: 4 %
Hematocrit: 42.4 % (ref 34.0–46.6)
Hemoglobin: 13.4 g/dL (ref 11.1–15.9)
Immature Grans (Abs): 0 10*3/uL (ref 0.0–0.1)
Immature Granulocytes: 0 %
Lymphocytes Absolute: 2.4 10*3/uL (ref 0.7–3.1)
Lymphs: 25 %
MCH: 27.5 pg (ref 26.6–33.0)
MCHC: 31.6 g/dL (ref 31.5–35.7)
MCV: 87 fL (ref 79–97)
Monocytes Absolute: 0.7 10*3/uL (ref 0.1–0.9)
Monocytes: 7 %
Neutrophils Absolute: 6.2 10*3/uL (ref 1.4–7.0)
Neutrophils: 63 %
Platelets: 416 10*3/uL (ref 150–450)
RBC: 4.88 x10E6/uL (ref 3.77–5.28)
RDW: 14.1 % (ref 11.7–15.4)
WBC: 9.8 10*3/uL (ref 3.4–10.8)

## 2020-04-20 LAB — COMPREHENSIVE METABOLIC PANEL
ALT: 8 IU/L (ref 0–32)
AST: 12 IU/L (ref 0–40)
Albumin/Globulin Ratio: 1.7 (ref 1.2–2.2)
Albumin: 4.2 g/dL (ref 3.8–4.8)
Alkaline Phosphatase: 55 IU/L (ref 48–121)
BUN/Creatinine Ratio: 10 (ref 9–23)
BUN: 9 mg/dL (ref 6–24)
Bilirubin Total: 0.2 mg/dL (ref 0.0–1.2)
CO2: 22 mmol/L (ref 20–29)
Calcium: 8.9 mg/dL (ref 8.7–10.2)
Chloride: 103 mmol/L (ref 96–106)
Creatinine, Ser: 0.87 mg/dL (ref 0.57–1.00)
GFR calc Af Amer: 91 mL/min/{1.73_m2} (ref >59)
GFR calc non Af Amer: 79 mL/min/{1.73_m2} (ref >59)
Globulin, Total: 2.5 g/dL (ref 1.5–4.5)
Glucose: 92 mg/dL (ref 65–99)
Potassium: 4.8 mmol/L (ref 3.5–5.2)
Sodium: 141 mmol/L (ref 134–144)
Total Protein: 6.7 g/dL (ref 6.0–8.5)

## 2020-04-20 LAB — SARS-COV-2 SEMI-QUANTITATIVE TOTAL ANTIBODY, SPIKE
SARS-CoV-2 Semi-Quant Total Ab: 950.7 U/mL (ref <0.8)
SARS-CoV-2 Spike Ab Interp: POSITIVE

## 2020-04-20 LAB — TSH: TSH: 1.98 u[IU]/mL (ref 0.450–4.500)

## 2020-04-20 LAB — HEPATITIS C ANTIBODY: Hep C Virus Ab: <0.1 s/co ratio (ref 0.0–0.9)

## 2020-04-27 ENCOUNTER — Encounter: Payer: Self-pay | Admitting: Family Medicine

## 2020-05-06 ENCOUNTER — Other Ambulatory Visit: Payer: Self-pay | Admitting: Family Medicine

## 2020-05-12 LAB — COLOGUARD: Cologuard: NEGATIVE

## 2020-05-18 LAB — COLOGUARD: COLOGUARD: NEGATIVE

## 2020-05-22 ENCOUNTER — Other Ambulatory Visit: Payer: Self-pay | Admitting: Family Medicine

## 2020-09-06 ENCOUNTER — Other Ambulatory Visit: Payer: Self-pay | Admitting: Nurse Practitioner

## 2020-09-06 ENCOUNTER — Other Ambulatory Visit: Payer: Self-pay | Admitting: Family Medicine

## 2020-09-06 MED ORDER — CLONAZEPAM 0.5 MG PO TABS: 0.2500 mg | ORAL_TABLET | Freq: Two times a day (BID) | ORAL | 0 refills | Status: DC | PRN

## 2020-09-06 NOTE — Telephone Encounter (Signed)
Please schedule for visit in February for follow-up meds.  Sent in 90 day refills.

## 2020-09-06 NOTE — Telephone Encounter (Signed)
PT scheduled, verbalized understanding.

## 2020-09-06 NOTE — Telephone Encounter (Signed)
Requested medication (s) are due for refill today:   Provider to determine  Requested medication (s) are on the active medication list:   Yes  Future visit scheduled:   No   Last ordered: 09/13/2019 #90, 0 refills  Non delegated refill   Requested Prescriptions  Pending Prescriptions Disp Refills   clonazePAM (KLONOPIN) 0.5 MG tablet [Pharmacy Med Name: clonazePAM 0.5 MG Oral Tablet] 90 tablet     Sig: TAKE 1/2 TO 1 TABLET BY  MOUTH TWICE DAILY AS NEEDED FOR ANXIETY      Not Delegated - Psychiatry:  Anxiolytics/Hypnotics Failed - 09/06/2020  7:00 AM      Failed - This refill cannot be delegated      Failed - Urine Drug Screen completed in last 360 days      Passed - Valid encounter within last 6 months    Recent Outpatient Visits           4 months ago Routine general medical examination at a health care facility   [facility], [place], DO   10 months ago Generalized anxiety disorder   [place], [place], [place]   11 months ago Generalized anxiety disorder   [facility], [place]   1 year ago Generalized anxiety disorder   [place], [address], DO   2 years ago Nightmares   [place], [place], DO

## 2020-09-25 ENCOUNTER — Other Ambulatory Visit: Payer: Self-pay

## 2020-09-25 NOTE — Telephone Encounter (Signed)
Requesting this to go to [facility]

## 2020-10-23 ENCOUNTER — Other Ambulatory Visit: Payer: Self-pay | Admitting: Family Medicine

## 2020-10-24 ENCOUNTER — Ambulatory Visit: Payer: Managed Care, Other (non HMO) | Admitting: Nurse Practitioner

## 2020-10-24 NOTE — Progress Notes (Deleted)
Established Patient Office Visit  Subjective:  Patient ID: Heidi Taylor, female    DOB: 04-16-1971  Age: 50 y.o. MRN: [id number]  CC: No chief complaint on file.   HPI Heidi Taylor presents for ***  ANXIETY/STRESS  Currently taking Klonopin 0.64m prn.  Duration:{Blank single:19197::"controlled","uncontrolled","better","worse","exacerbated","stable"} Anxious mood: {Blank single:19197::"yes","no"}  Excessive worrying: {Blank single:19197::"yes","no"} Irritability: {Blank single:19197::"yes","no"}  Sweating: {Blank single:19197::"yes","no"} Nausea: {Blank single:19197::"yes","no"} Palpitations:{Blank single:19197::"yes","no"} Hyperventilation: {Blank single:19197::"yes","no"} Panic attacks: {Blank single:19197::"yes","no"} Agoraphobia: {Blank single:19197::"yes","no"}  Obscessions/compulsions: {Blank single:19197::"yes","no"} Depressed mood: {Blank single:19197::"yes","no"}   [facility] 04/19/2020 11/08/2019 09/13/2019 02/01/2019 03/06/2018  Decreased Interest 1 1 1 1 1   Down, Depressed, Hopeless 1 1 1 1 1   PHQ - 2 Score 2 2 2 2 2   Altered sleeping 2 3 2  0 1  Tired, decreased energy 2 1 1  0 1  Change in appetite 0 0 1 0 1  Feeling bad or failure about yourself  0 0 0 0 0  Trouble concentrating 0 0 0 0 0  Moving slowly or fidgety/restless 0 0 0 0 0  Suicidal thoughts 0 0 0 0 0  PHQ-9 Score 6 6 6 2 5   Difficult doing work/chores Somewhat difficult Somewhat difficult - Not difficult at all Somewhat difficult   Anhedonia: {Blank single:19197::"yes","no"} Weight changes: {Blank single:19197::"yes","no"} Insomnia: {Blank single:19197::"yes","no"} {Blank single:19197::"hard to fall asleep","hard to stay asleep"}  Hypersomnia: {Blank single:19197::"yes","no"} Fatigue/loss of energy: {Blank single:19197::"yes","no"} Feelings of worthlessness: {Blank single:19197::"yes","no"} Feelings of guilt: {Blank single:19197::"yes","no"} Impaired  concentration/indecisiveness: {Blank single:19197::"yes","no"} Suicidal ideations: {Blank single:19197::"yes","no"}  Crying spells: {Blank single:19197::"yes","no"} Recent Stressors/Life Changes: {Blank single:19197::"yes","no"}   INSOMNIA   Currently taking Trazadone 592mat bedtime Duration: {Blank single:19197::"chronic","months","years"} Satisfied with sleep quality: {Blank single:19197::"yes","no"} Difficulty falling asleep: {Blank single:19197::"yes","no"} Difficulty staying asleep: {Blank single:19197::"yes","no"} Waking a few hours after sleep onset: {Blank single:19197::"yes","no"} Early morning awakenings: {Blank single:19197::"yes","no"} Daytime hypersomnolence: {Blank single:19197::"yes","no"} Wakes feeling refreshed: {Blank single:19197::"yes","no"} Good sleep hygiene: {Blank single:19197::"yes","no"} Apnea: {Blank single:19197::"yes","no"} Snoring: {Blank single:19197::"yes","no"} Depressed/anxious mood: {Blank single:19197::"yes","no"} Recent stress: {Blank single:19197::"yes","no"} Restless legs/nocturnal leg cramps: {Blank single:19197::"yes","no"} Chronic pain/arthritis: {Blank single:19197::"yes","no"} History of sleep study: {Blank single:19197::"yes","no"} Treatments attempted: {Blank multiple:19196::"none","melatonin","uinsom","benadryl","ambien"}   Past Medical History:  Diagnosis Date  . Anxiety   . DDD (degenerative disc disease), lumbar     Past Surgical History:  Procedure Laterality Date  . HERNIA REPAIR      Family History  Problem Relation Age of Onset  . Cancer Mother        Brain  . Parkinson's disease Father   . Parkinson's disease Paternal Grandfather   . Breast cancer Maternal Aunt     Social History   Socioeconomic History  . Marital status: Married    Spouse name: Not on file  . Number of children: Not on file  . Years of education: Not on file  . Highest education level: Not on file  Occupational History  . Not on file   Tobacco Use  . Smoking status: Former Smoker    Quit date: 09/06/2009    Years since quitting: 11.1  . Smokeless tobacco: Never Used  Vaping Use  . Vaping Use: Never used  Substance and Sexual Activity  . Alcohol use: Yes    Comment: On occasion  . Drug use: No  . Sexual activity: Yes    Birth control/protection: None  Other Topics Concern  . Not on file  Social History Narrative  . Not on file   Social Determinants of Health   Financial Resource Strain: Not on file  Food Insecurity: Not on file  Transportation Needs: Not on file  Physical Activity: Not on file  Stress: Not on file  Social Connections: Not on file  Intimate Partner Violence: Not on file    Outpatient Medications Prior to Visit  Medication Sig Dispense Refill  . clonazePAM (KLONOPIN) 0.5 MG tablet Take 0.5-1 tablets (0.25-0.5 mg total) by mouth 2 (two) times daily as needed for anxiety. 90 tablet 0  . cyclobenzaprine (FLEXERIL) 10 MG tablet Take 1 tablet (10 mg total) by mouth at bedtime. 30 tablet 0  . fluticasone (FLONASE) 50 MCG/ACT nasal spray Place into the nose.    . gabapentin (NEURONTIN) 300 MG capsule Take by mouth as needed.     . naproxen (NAPROSYN) 500 MG tablet Take 1 tablet (500 mg total) by mouth 2 (two) times daily with a meal. 180 tablet 1  . tolterodine (DETROL LA) 2 MG 24 hr capsule Take 1 capsule (2 mg total) by mouth daily. 90 capsule 1  . traZODone (DESYREL) 50 MG tablet Take 0.5-1 tablets (25-50 mg total) by mouth at bedtime as needed. for sleep 90 tablet 3   No facility-administered medications prior to visit.    Allergies  Allergen Reactions  . Duloxetine Other (See Comments)    GI and weight  . Sertraline Other (See Comments)    GI and weight  . Trintellix [Vortioxetine] Nausea And Vomiting    ROS Review of Systems    Objective:    Physical Exam  There were no vitals taken for this visit. Wt Readings from Last 3 Encounters:  04/19/20 193 lb 6.4 oz (87.7 kg)   09/13/19 203 lb (92.1 kg)  02/14/19 212 lb (96.2 kg)     Health Maintenance Due  Topic Date Due  . INFLUENZA VACCINE  04/23/2020  . COVID-19 Vaccine (3 - Booster for Pfizer series) 07/12/2020  . MAMMOGRAM  09/22/2020    There are no preventive care reminders to display for this patient.  Lab Results  Component Value Date   TSH 1.980 04/19/2020   Lab Results  Component Value Date   WBC 9.8 04/19/2020   HGB 13.4 04/19/2020   HCT 42.4 04/19/2020   MCV 87 04/19/2020   PLT 416 04/19/2020   Lab Results  Component Value Date   NA 141 04/19/2020   K 4.8 04/19/2020   CO2 22 04/19/2020   GLUCOSE 92 04/19/2020   BUN 9 04/19/2020   CREATININE 0.87 04/19/2020   BILITOT 0.2 04/19/2020   ALKPHOS 55 04/19/2020   AST 12 04/19/2020   ALT 8 04/19/2020   PROT 6.7 04/19/2020   ALBUMIN 4.2 04/19/2020   CALCIUM 8.9 04/19/2020   Lab Results  Component Value Date   CHOL 190 04/19/2020   Lab Results  Component Value Date   HDL 48 04/19/2020   Lab Results  Component Value Date   LDLCALC 129 (H) 04/19/2020   Lab Results  Component Value Date   TRIG 69 04/19/2020   No results found for: CHOLHDL Lab Results  Component Value Date   HGBA1C 5.2 04/19/2020      Assessment & Plan:   Problem List Items Addressed This Visit   None     No orders of the defined types were placed in this encounter.   Follow-up: No follow-ups on file.    Charyl Dancer, NP

## 2020-10-24 NOTE — Telephone Encounter (Signed)
Requested medications are due for refill today.  yes  Requested medications are on the active medications list.  yes  Last refill. 04/19/2020  Future visit scheduled.  Today  Notes to clinic.  Would like 1 year supply.

## 2020-11-06 ENCOUNTER — Encounter: Payer: Self-pay | Admitting: Family Medicine

## 2020-11-06 ENCOUNTER — Other Ambulatory Visit: Payer: Self-pay | Admitting: Family Medicine

## 2020-11-06 ENCOUNTER — Ambulatory Visit: Payer: Managed Care, Other (non HMO) | Admitting: Family Medicine

## 2020-11-06 ENCOUNTER — Telehealth (INDEPENDENT_AMBULATORY_CARE_PROVIDER_SITE_OTHER): Payer: Managed Care, Other (non HMO) | Admitting: Family Medicine

## 2020-11-06 VITALS — Wt 198.0 lb

## 2020-11-06 DIAGNOSIS — J069 Acute upper respiratory infection, unspecified: Secondary | ICD-10-CM | POA: Diagnosis not present

## 2020-11-06 MED ORDER — PREDNISONE 50 MG PO TABS: 50.0000 mg | ORAL_TABLET | Freq: Every day | ORAL | 0 refills | Status: DC

## 2020-11-06 NOTE — Progress Notes (Signed)
Wt 198 lb (89.8 kg)   BMI 33.80 kg/m    Subjective:    Patient ID: Heidi Taylor, female    DOB: 11/24/1970, 50 y.o.   MRN: [id number]  HPI: Heidi Taylor is a 50 y.o. female  Chief Complaint  Patient presents with  . Fever  . Diarrhea    Pt states she was diagnosed covid positive back in January. Pt states she started feeling symptomatic Thursday sx headache, fever (99.0) and diarrhea. Pt states the headaches and diarrhea has gotten better since trying over the counter medications.   UPPER RESPIRATORY TRACT INFECTION Duration: For about 3 days Worst symptom: fever and diarrhea Fever: yes Cough: yes Shortness of breath: no Wheezing: no Chest pain: no Chest tightness: no Chest congestion: no Nasal congestion: yes Runny nose: yes Post nasal drip: yes Sneezing: no Sore throat: no Swollen glands: no Sinus pressure: no Headache: yes Face pain: no Toothache: no Ear pain: yes left Ear pressure: no  Eyes red/itching:no Eye drainage/crusting: no  Vomiting: no Rash: no Fatigue: yes Sick contacts: no Strep contacts: no  Context: better Recurrent sinusitis: no Relief with OTC cold/cough medications: no  Treatments attempted: flonase   Relevant past medical, surgical, family and social history reviewed and updated as indicated. Interim medical history since our last visit reviewed. Allergies and medications reviewed and updated.  Review of Systems  Constitutional: Positive for fatigue and fever. Negative for activity change, appetite change, chills, diaphoresis and unexpected weight change.  HENT: Positive for congestion, postnasal drip, rhinorrhea and sinus pressure. Negative for dental problem, drooling, ear discharge, ear pain, facial swelling, hearing loss, mouth sores, nosebleeds, sinus pain, sneezing, sore throat, tinnitus, trouble swallowing and voice change.   Eyes: Negative.   Respiratory: Positive for cough. Negative for apnea, choking, chest tightness,  shortness of breath, wheezing and stridor.   Cardiovascular: Negative.   Gastrointestinal: Positive for diarrhea. Negative for abdominal distention, abdominal pain, anal bleeding, blood in stool, constipation, nausea, rectal pain and vomiting.  Psychiatric/Behavioral: Negative.     Per HPI unless specifically indicated above     Objective:    Wt 198 lb (89.8 kg)   BMI 33.80 kg/m   Wt Readings from Last 3 Encounters:  11/06/20 198 lb (89.8 kg)  04/19/20 193 lb 6.4 oz (87.7 kg)  09/13/19 203 lb (92.1 kg)    Physical Exam Vitals and nursing note reviewed.  Constitutional:      General: She is not in acute distress.    Appearance: Normal appearance. She is not ill-appearing, toxic-appearing or diaphoretic.  HENT:     Head: Normocephalic and atraumatic.     Right Ear: External ear normal.     Left Ear: External ear normal.     Nose: Nose normal.     Mouth/Throat:     Mouth: Mucous membranes are moist.     Pharynx: Oropharynx is clear.  Eyes:     General: No scleral icterus.       Right eye: No discharge.        Left eye: No discharge.     Conjunctiva/sclera: Conjunctivae normal.     Pupils: Pupils are equal, round, and reactive to light.  Pulmonary:     Effort: Pulmonary effort is normal. No respiratory distress.     Comments: Speaking in full sentences Musculoskeletal:        General: Normal range of motion.     Cervical back: Normal range of motion.  Skin:    Coloration: Skin is  not jaundiced or pale.     Findings: No bruising, erythema, lesion or rash.  Neurological:     Mental Status: She is alert and oriented to person, place, and time. Mental status is at baseline.  Psychiatric:        Mood and Affect: Mood normal.        Behavior: Behavior normal.        Thought Content: Thought content normal.        Judgment: Judgment normal.     Results for orders placed or performed in visit on 05/19/20  Cologuard  Result Value Ref Range   Cologuard Negative Negative       Assessment & Plan:   Problem List Items Addressed This Visit   None   Visit Diagnoses    Viral upper respiratory tract infection    -  Primary   Had COVID less than a month ago, so will hold on testing. Will treat with prednisone for comfort. Call if not getting better/getting worse. Continue to monitor       Follow up plan: Return July, for Physical.   . This visit was completed via MyChart due to the restrictions of the COVID-19 pandemic. All issues as above were discussed and addressed. Physical exam was done as above through visual confirmation on MyChart. If it was felt that the patient should be evaluated in the office, they were directed there. The patient verbally consented to this visit. . Location of the patient: home . Location of the provider: work . Those involved with this call:  . Provider: Park Liter, DO . CMA: Irena Reichmann, [place] . Front Desk/Registration: Jill Side  . Time spent on call: 15 minutes on the phone discussing health concerns. 23 minutes total spent in review of patient's record and preparation of their chart.

## 2020-11-07 ENCOUNTER — Other Ambulatory Visit: Payer: Self-pay | Admitting: Family Medicine

## 2020-11-07 NOTE — Telephone Encounter (Signed)
Requested Prescriptions  Pending Prescriptions Disp Refills  . tolterodine (DETROL LA) 2 MG 24 hr capsule [Pharmacy Med Name: TOLTERODINE  2MG  CAP  EXTENDED RELEASE] 90 capsule 0    Sig: TAKE 1 CAPSULE BY MOUTH  DAILY     Urology:  Bladder Agents Passed - 11/07/2020 11:48 PM      Passed - Valid encounter within last 12 months    Recent Outpatient Visits          Yesterday Viral upper respiratory tract infection   [place], [place], DO   6 months ago Routine general medical examination at a health care facility   [facility], [place], DO   1 year ago Generalized anxiety disorder   [place], [place], DO   1 year ago Generalized anxiety disorder   [place], [place]   1 year ago Generalized anxiety disorder   [place], [place], DO

## 2020-11-07 NOTE — Telephone Encounter (Signed)
Pt stated she is no longer taking this medication Pt asking if can be taken off.

## 2020-11-07 NOTE — Telephone Encounter (Signed)
Requested medications are due for refill today.  yes  Requested medications are on the active medications list.  yes  Last refill.04/19/2020 - Per note of visit 11/06/2020 pt no longer taking.  Future visit scheduled.   No  Notes to clinic.  Rx was refused 10/24/2020 and ws supposed to be discussed at [place]. Please advise.

## 2020-11-07 NOTE — Telephone Encounter (Signed)
Requested Prescriptions  Pending Prescriptions Disp Refills  . naproxen (NAPROSYN) 500 MG tablet [Pharmacy Med Name: Naproxen 500 MG Oral Tablet] 180 tablet 1    Sig: TAKE 1 TABLET BY MOUTH  TWICE DAILY WITH MEALS     Analgesics:  NSAIDS Passed - 11/07/2020  5:31 AM      Passed - Cr in normal range and within 360 days    Creatinine, Ser  Date Value Ref Range Status  04/19/2020 0.87 0.57 - 1.00 mg/dL Final         Passed - HGB in normal range and within 360 days    Hemoglobin  Date Value Ref Range Status  04/19/2020 13.4 11.1 - 15.9 g/dL Final         Passed - Patient is not pregnant      Passed - Valid encounter within last 12 months    Recent Outpatient Visits          Yesterday Viral upper respiratory tract infection   [place], [place], DO   6 months ago Routine general medical examination at a health care facility   [place], [place], DO   1 year ago Generalized anxiety disorder   [place], [place], DO   1 year ago Generalized anxiety disorder   [facility], [place]   1 year ago Generalized anxiety disorder   [place], [address], DO

## 2020-11-08 ENCOUNTER — Telehealth: Payer: Self-pay | Admitting: *Deleted

## 2020-11-08 NOTE — Telephone Encounter (Signed)
Pt was called and I LVM for her to call back and schedule a Physical for July

## 2020-11-08 NOTE — Telephone Encounter (Signed)
-----   Message from Valerie Roys, DO sent at 11/06/2020 10:11 AM EST ----- [id number]- physical

## 2021-01-04 ENCOUNTER — Other Ambulatory Visit: Payer: Self-pay | Admitting: Family Medicine

## 2021-01-04 MED ORDER — CYCLOBENZAPRINE HCL 10 MG PO TABS: 10.0000 mg | ORAL_TABLET | Freq: Every day | ORAL | 0 refills | Status: DC

## 2021-01-09 ENCOUNTER — Encounter: Payer: Self-pay | Admitting: Family Medicine

## 2021-01-11 ENCOUNTER — Telehealth: Payer: Managed Care, Other (non HMO) | Admitting: Family Medicine

## 2021-01-11 ENCOUNTER — Encounter: Payer: Self-pay | Admitting: Family Medicine

## 2021-01-11 ENCOUNTER — Other Ambulatory Visit: Payer: Self-pay

## 2021-01-11 DIAGNOSIS — M5137 Other intervertebral disc degeneration, lumbosacral region: Secondary | ICD-10-CM | POA: Diagnosis not present

## 2021-01-11 MED ORDER — OXYCODONE-ACETAMINOPHEN 10-325 MG PO TABS: 1.0000 | ORAL_TABLET | Freq: Three times a day (TID) | ORAL | 0 refills | Status: AC | PRN

## 2021-01-11 MED ORDER — PREDNISONE 10 MG PO TABS: ORAL_TABLET | ORAL | 0 refills | Status: DC

## 2021-01-11 NOTE — Assessment & Plan Note (Signed)
Acting up again. Seeing surgeon next week. Will treat with percocet and prednisone. See surgeon as scheduled. Call with any concerns. Continue to monitor.

## 2021-01-11 NOTE — Progress Notes (Signed)
There were no vitals taken for this visit.   Subjective:    Patient ID: Heidi Taylor, female    DOB: [birth year], 50 y.o.   MRN: [id number]  HPI: Heidi Taylor is a 50 y.o. female  Chief Complaint  Patient presents with  . Back Pain    Patient states she ruptured another disc in her back about a week ago. Patient has an apt with her spine doctor next week. Patient would like something to get her through until next week.    BACK PAIN Duration: about a week Mechanism of injury: unknown Location: L>R, bilateral and low back Onset: sudden Severity: severe Quality: shooting pain and numbness Frequency: constant Radiation: bilateral legs Aggravating factors: movement and walking Alleviating factors: rest, ice, heat, laying and NSAIDs Status: worse Treatments attempted: rest, ice, heat, APAP, ibuprofen and aleve  Relief with NSAIDs?: no Nighttime pain:  yes Paresthesias / decreased sensation:  yes Bowel / bladder incontinence:  no Fevers:  no Dysuria / urinary frequency:  no  Relevant past medical, surgical, family and social history reviewed and updated as indicated. Interim medical history since our last visit reviewed. Allergies and medications reviewed and updated.  Review of Systems  Constitutional: Negative.   Respiratory: Negative.   Cardiovascular: Negative.   Gastrointestinal: Negative.   Musculoskeletal: Positive for back pain and myalgias. Negative for arthralgias, gait problem, joint swelling, neck pain and neck stiffness.  Skin: Negative.   Neurological: Positive for weakness and numbness. Negative for dizziness, tremors, seizures, syncope, facial asymmetry, speech difficulty, light-headedness and headaches.  Psychiatric/Behavioral: Negative.     Per HPI unless specifically indicated above     Objective:    There were no vitals taken for this visit.  Wt Readings from Last 3 Encounters:  11/06/20 198 lb (89.8 kg)  04/19/20 193 lb 6.4 oz (87.7 kg)   09/13/19 203 lb (92.1 kg)    Physical Exam Vitals and nursing note reviewed.  Constitutional:      General: She is not in acute distress.    Appearance: Normal appearance. She is not ill-appearing, toxic-appearing or diaphoretic.  HENT:     Head: Normocephalic and atraumatic.     Right Ear: External ear normal.     Left Ear: External ear normal.     Nose: Nose normal.     Mouth/Throat:     Mouth: Mucous membranes are moist.     Pharynx: Oropharynx is clear.  Eyes:     General: No scleral icterus.       Right eye: No discharge.        Left eye: No discharge.     Conjunctiva/sclera: Conjunctivae normal.     Pupils: Pupils are equal, round, and reactive to light.  Pulmonary:     Effort: Pulmonary effort is normal. No respiratory distress.     Comments: Speaking in full sentences Musculoskeletal:        General: Normal range of motion.     Cervical back: Normal range of motion.  Skin:    Coloration: Skin is not jaundiced or pale.     Findings: No bruising, erythema, lesion or rash.  Neurological:     Mental Status: She is alert and oriented to person, place, and time. Mental status is at baseline.  Psychiatric:        Mood and Affect: Mood normal.        Behavior: Behavior normal.        Thought Content: Thought content normal.  Judgment: Judgment normal.     Results for orders placed or performed in visit on 05/19/20  Cologuard  Result Value Ref Range   Cologuard Negative Negative      Assessment & Plan:   Problem List Items Addressed This Visit      Musculoskeletal and Integument   DDD (degenerative disc disease), lumbosacral    Acting up again. Seeing surgeon next week. Will treat with percocet and prednisone. See surgeon as scheduled. Call with any concerns. Continue to monitor.       Relevant Medications   oxyCODONE-acetaminophen (PERCOCET) 10-325 MG tablet   predniSONE (DELTASONE) 10 MG tablet       Follow up plan: Return if symptoms worsen or  fail to improve.   . This visit was completed via video visit through MyChart due to the restrictions of the COVID-19 pandemic. All issues as above were discussed and addressed. Physical exam was done as above through visual confirmation on video through MyChart. If it was felt that the patient should be evaluated in the office, they were directed there. The patient verbally consented to this visit. . Location of the patient: home . Location of the provider: work . Those involved with this call:  . Provider: Park Liter, DO . CMA: Louanna Raw, [place] . Front Desk/Registration: Jill Side  . Time spent on call: 15 minutes with patient face to face via video conference. More than 50% of this time was spent in counseling and coordination of care. 23 minutes total spent in review of patient's record and preparation of their chart.

## 2021-01-16 ENCOUNTER — Other Ambulatory Visit: Payer: Self-pay | Admitting: Physical Medicine & Rehabilitation

## 2021-01-16 ENCOUNTER — Other Ambulatory Visit (HOSPITAL_COMMUNITY): Payer: Self-pay | Admitting: Physical Medicine & Rehabilitation

## 2021-01-16 DIAGNOSIS — M5441 Lumbago with sciatica, right side: Secondary | ICD-10-CM

## 2021-01-20 ENCOUNTER — Ambulatory Visit
Admission: RE | Admit: 2021-01-20 | Discharge: 2021-01-20 | Disposition: A | Discharge status: Home / Self Care | Payer: Managed Care, Other (non HMO) | Source: Ambulatory Visit | Attending: Physical Medicine & Rehabilitation | Admitting: Physical Medicine & Rehabilitation

## 2021-01-20 DIAGNOSIS — M5441 Lumbago with sciatica, right side: Secondary | ICD-10-CM | POA: Insufficient documentation

## 2021-01-20 IMAGING — MR MR LUMBAR SPINE W/O CM
5 series · 31 of 48 positions shown · non-contrast
Comparison: None.

CLINICAL DATA: Low back pain over the last 3 years. Radiating pain
and tingling of both legs.

EXAM:
MRI LUMBAR SPINE WITHOUT CONTRAST
TECHNIQUE: Multiplanar, multisequence MR imaging of the lumbar spine was
performed. No intravenous contrast was administered.

[Series 5: T2 · sagittal · 4.0mm · 0.81mm/px · 6 of 17 slices shown (1 of 2)]
[im 1/17]
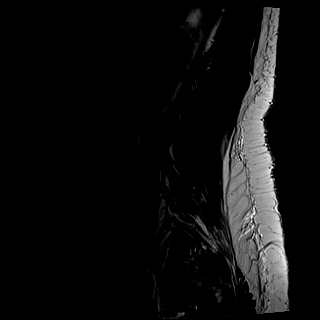
[im 4/17]
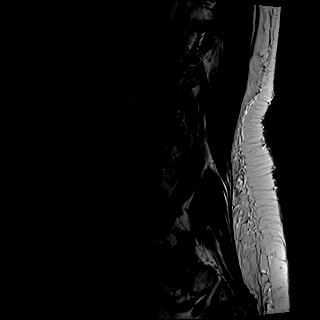
[im 7/17]
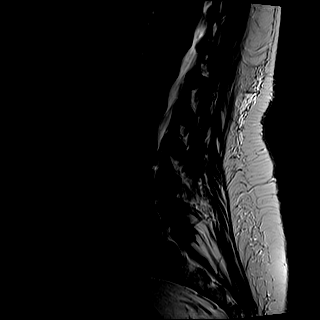
[im 10/17]
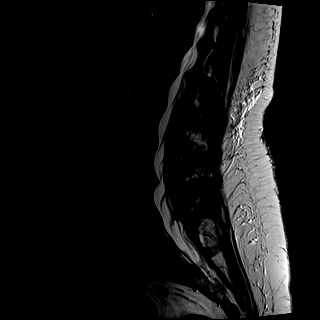
[im 13/17]
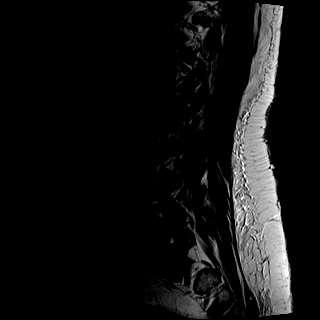
[im 17/17]
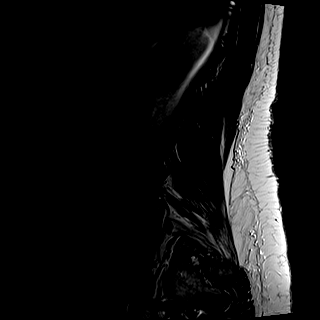

[Series 6: T1 · sagittal · 4.0mm · 0.81mm/px · 7 of 17 slices shown (1 of 2)]
[im 1/17]
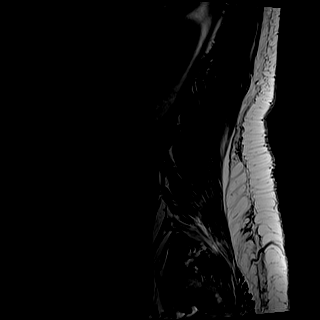
[im 3/17]
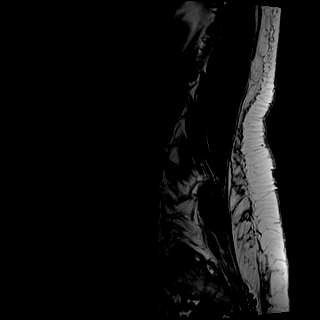
[im 6/17]
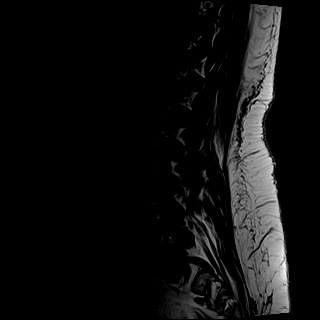
[im 9/17]
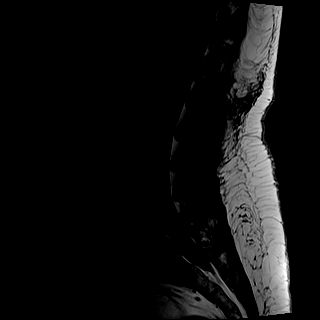
[im 11/17]
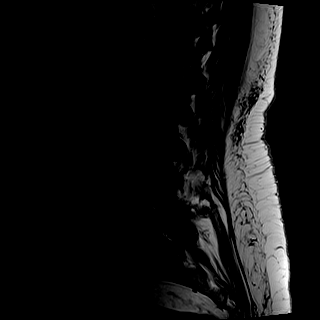
[im 14/17]
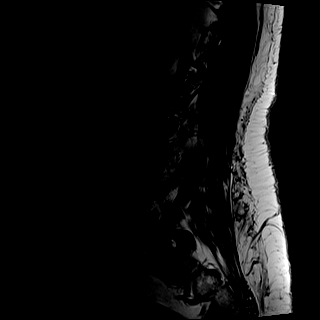
[im 17/17]
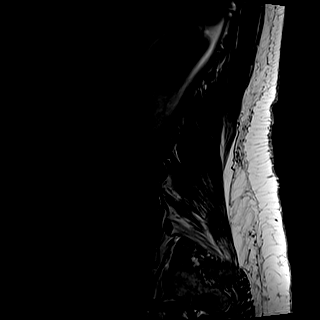

[Series 7: STIR · sagittal · 4.0mm · 0.41mm/px · 2 of 17 slices shown]
[im 1/17]
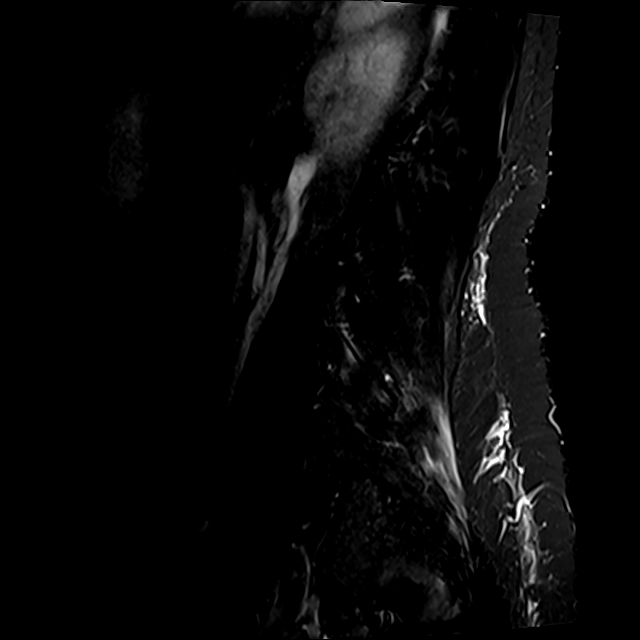
[im 3/17]
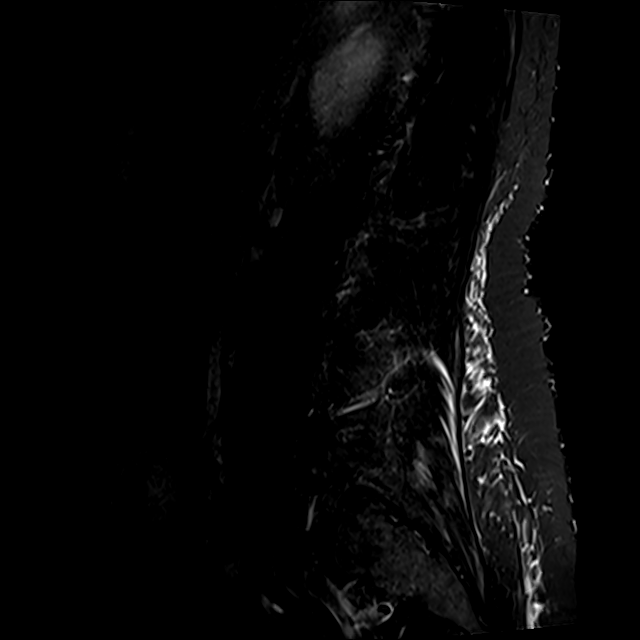

[Series 8: T2 · axial · 4.0mm · 0.78mm/px · z∈[-36,+158]mm · 8 of 34 slices shown (2 of 2)]
[im 1/34]
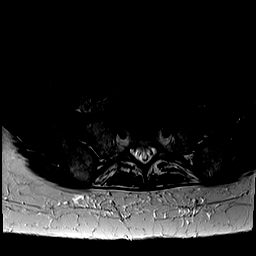
[im 6/34]
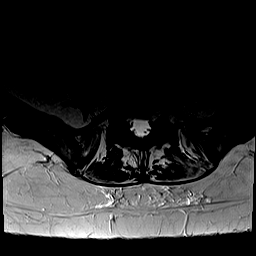
[im 11/34]
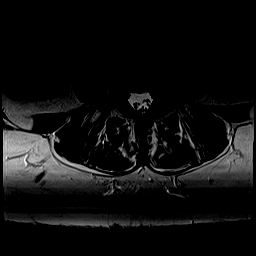
[im 16/34]
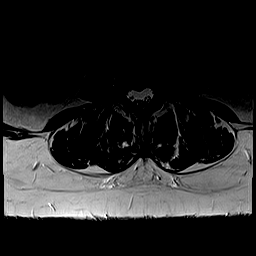
[im 18/34]
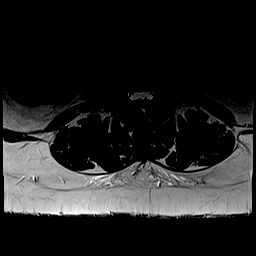
[im 23/34]
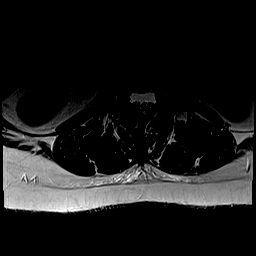
[im 28/34]
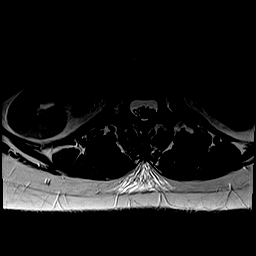
[im 34/34]
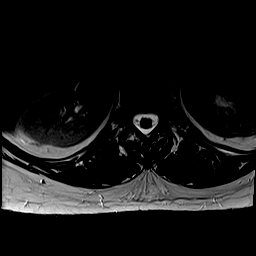

[Series 9: T1 · axial · 4.0mm · 0.39mm/px · z∈[-36,+158]mm · 8 of 34 slices shown (2 of 2)]
[im 1/34]
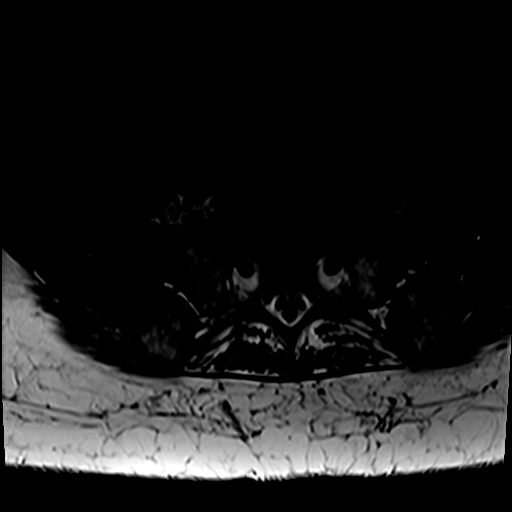
[im 6/34]
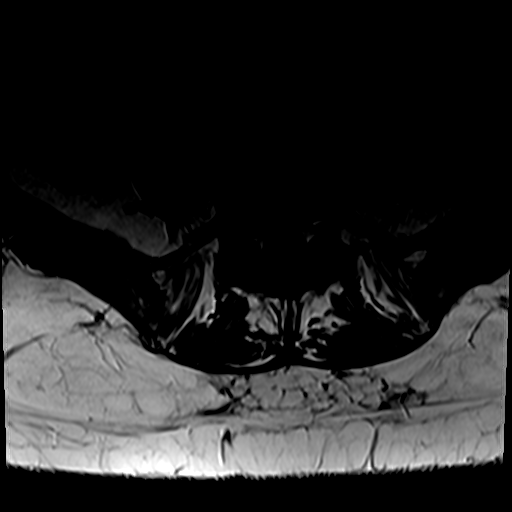
[im 11/34]
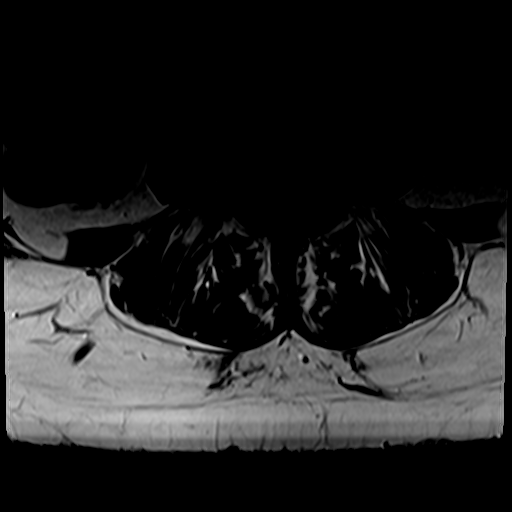
[im 16/34]
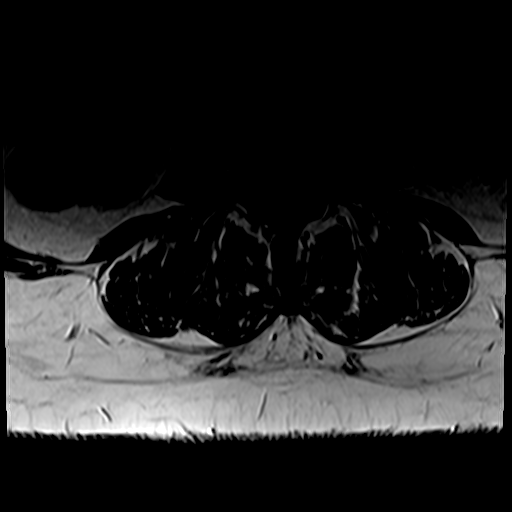
[im 18/34]
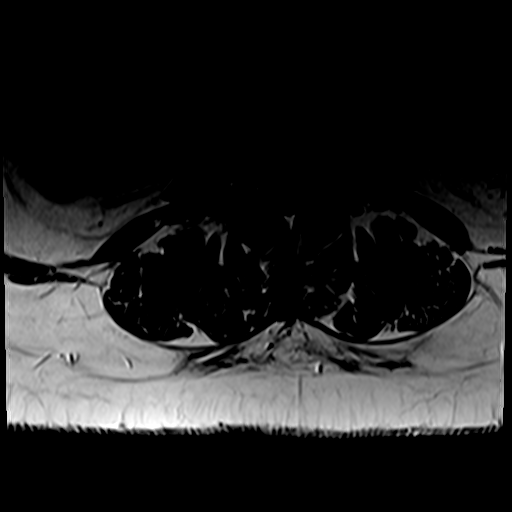
[im 23/34]
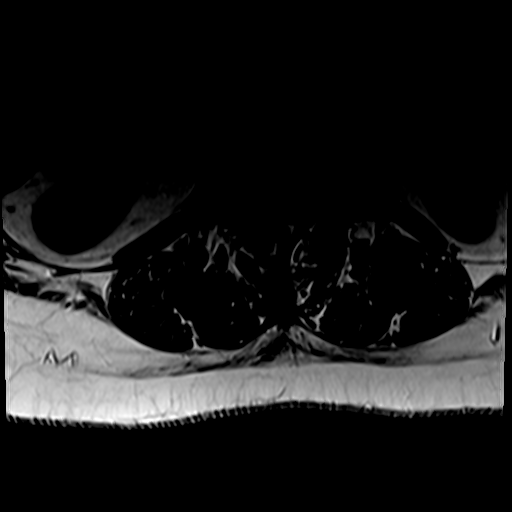
[im 28/34]
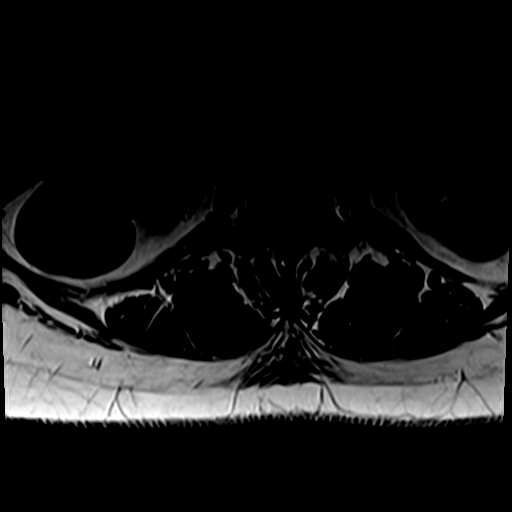
[im 34/34]
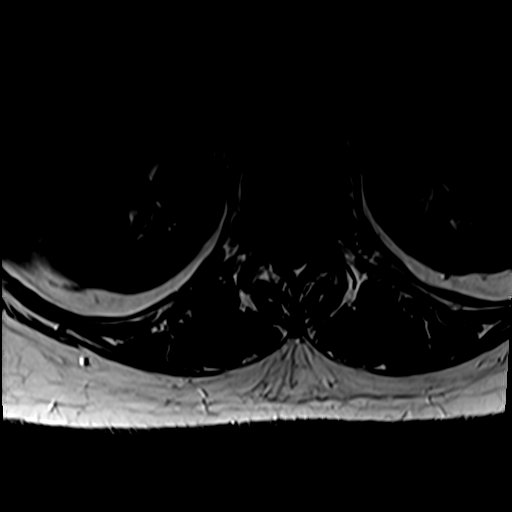

[31 of 48 positions shown; findings below may reference images not displayed]

FINDINGS: Segmentation:  5 lumbar type vertebral bodies.

Alignment:  Normal

Vertebrae: No fracture or primary bone lesion. Chronic discogenic
endplate marrow changes at L5-S1 without edema.

Conus medullaris and cauda equina: Conus extends to the L1-2 level.
Conus and cauda equina appear normal.

Paraspinal and other soft tissues: Negative

Disc levels:

Mild non-compressive disc bulges at L2-3 and above.

L3-4: Moderate bulging of the disc more towards the right. Mild
stenosis of the right lateral recess but no likely neural
compression.

L4-5: Minimal disc bulge and facet hypertrophy.  No stenosis.

L5-S1: Chronic disc degeneration with loss of disc height. Endplate
osteophytes. No compressive narrowing of the canal or foramina.
IMPRESSION: No advanced finding or likely symptomatic abnormality.
Non-compressive disc bulges at L4-5 and above. This is most
prominent at L3-4 with the disc bulges more towards the right and
results in mild right lateral recess stenosis but no visible or
distinct neural compression. Or advanced chronic disc degeneration
at L5-S1 with complete loss of disc height but no apparent
compressive stenosis.

## 2021-01-26 ENCOUNTER — Other Ambulatory Visit: Payer: Self-pay | Admitting: Family Medicine

## 2021-01-26 DIAGNOSIS — Z1231 Encounter for screening mammogram for malignant neoplasm of breast: Secondary | ICD-10-CM

## 2021-01-30 ENCOUNTER — Ambulatory Visit
Admission: RE | Admit: 2021-01-30 | Discharge: 2021-01-30 | Disposition: A | Discharge status: Home / Self Care | Payer: Managed Care, Other (non HMO) | Source: Ambulatory Visit | Attending: Family Medicine | Admitting: Family Medicine

## 2021-01-30 ENCOUNTER — Other Ambulatory Visit: Payer: Self-pay

## 2021-01-30 DIAGNOSIS — Z1231 Encounter for screening mammogram for malignant neoplasm of breast: Secondary | ICD-10-CM | POA: Insufficient documentation

## 2021-01-30 IMAGING — MG MM DIGITAL SCREENING BILAT W/ TOMO AND CAD
8 series · 8 of 24 positions shown · non-contrast
Comparison: Previous exam(s).

CLINICAL DATA: Screening.

EXAM:
DIGITAL SCREENING BILATERAL MAMMOGRAM WITH TOMOSYNTHESIS AND CAD
TECHNIQUE: Bilateral screening digital craniocaudal and mediolateral oblique
mammograms were obtained. Bilateral screening digital breast
tomosynthesis was performed. The images were evaluated with
computer-aided detection.

[L MLO synth-2D]
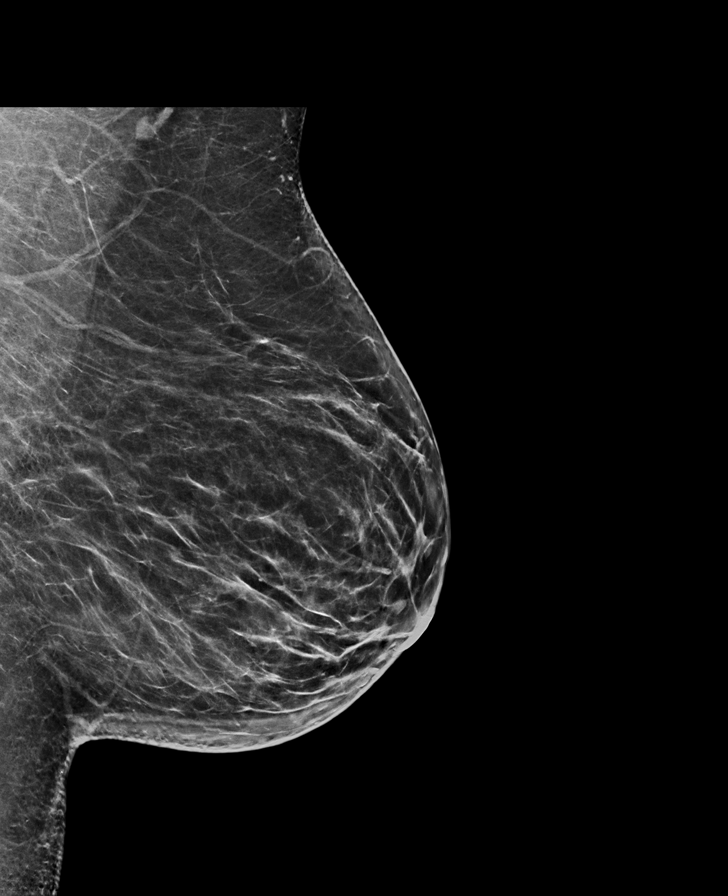

[R MLO synth-2D]
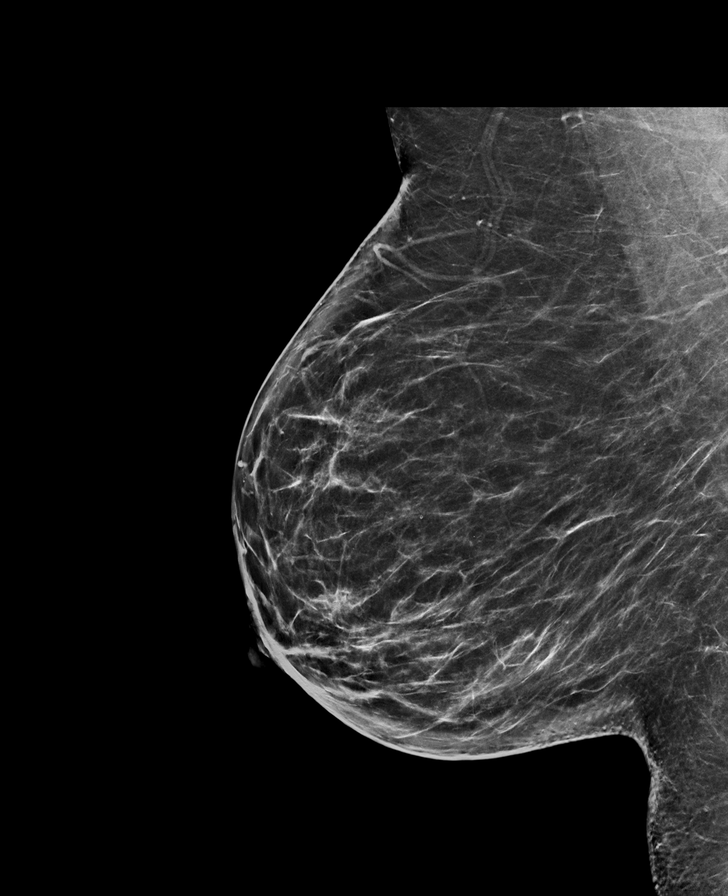

[L CC synth-2D]
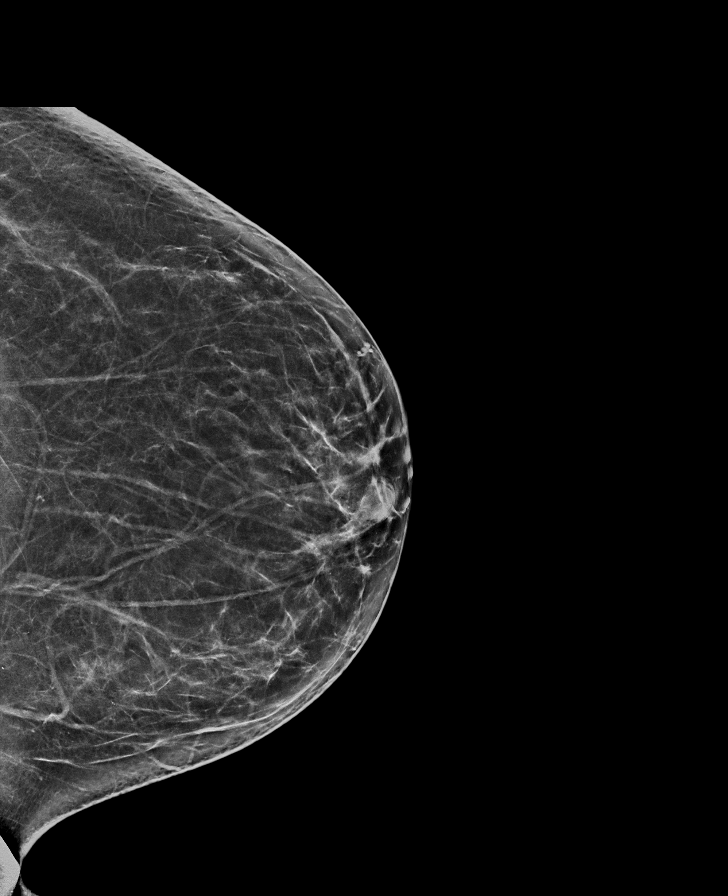

[R CC synth-2D]
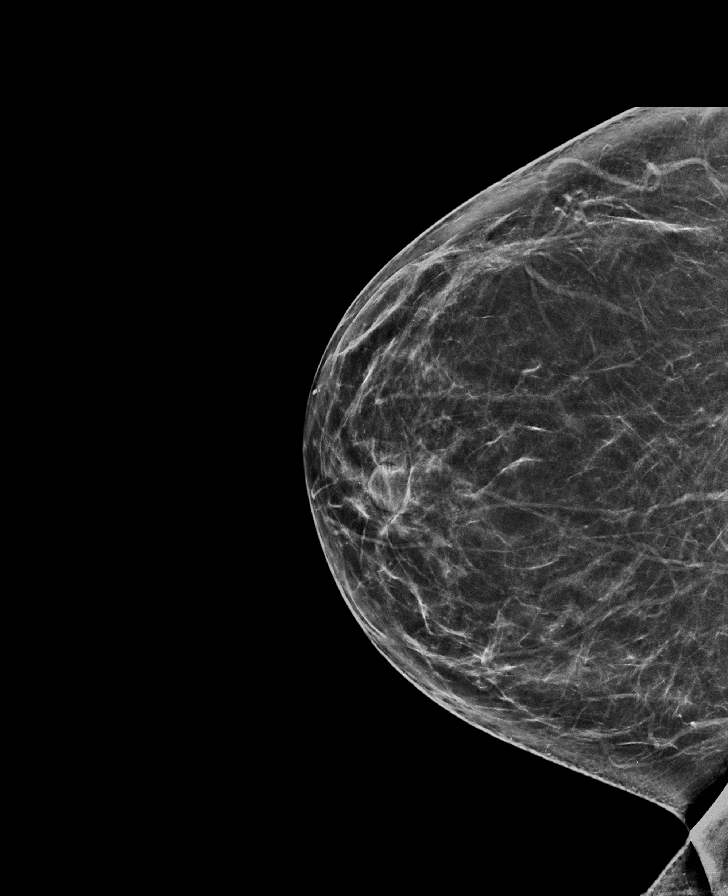

[L CC tomo · tomo slice 35/69.0]
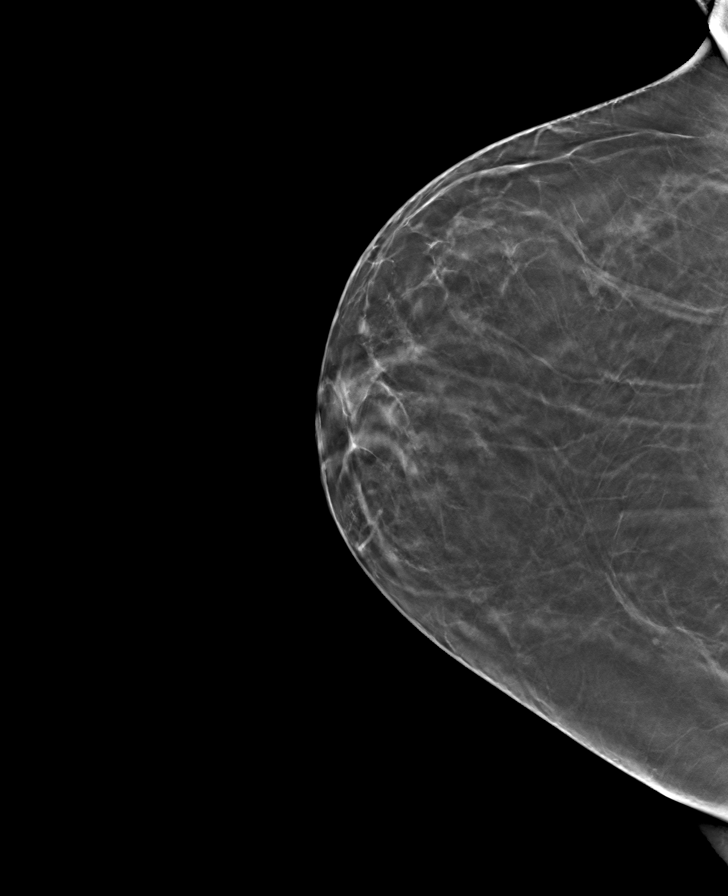

[R CC tomo · tomo slice 37/72.0]
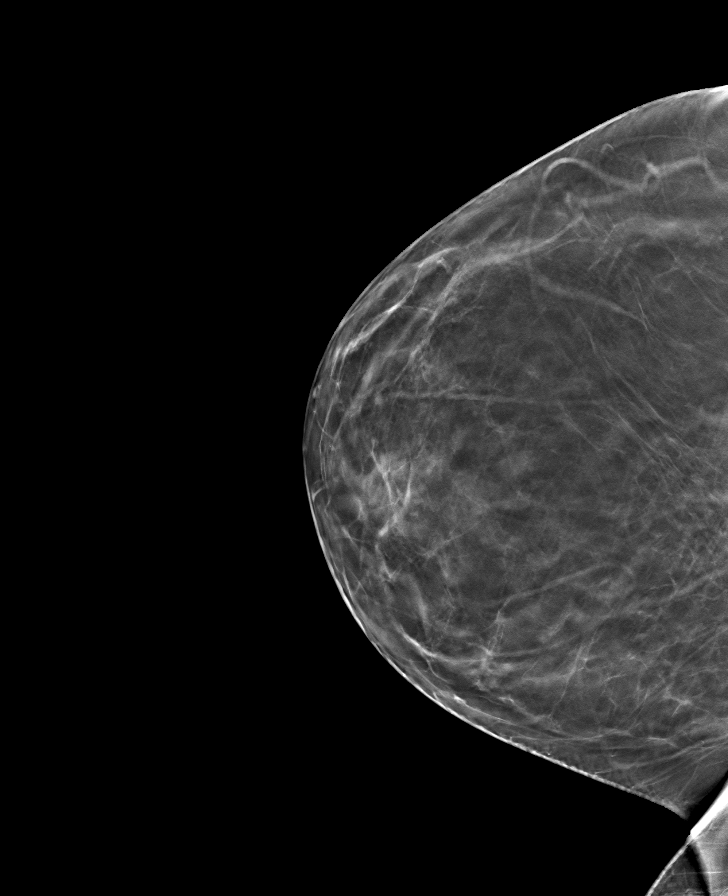

[L MLO tomo · tomo slice 37/74.0]
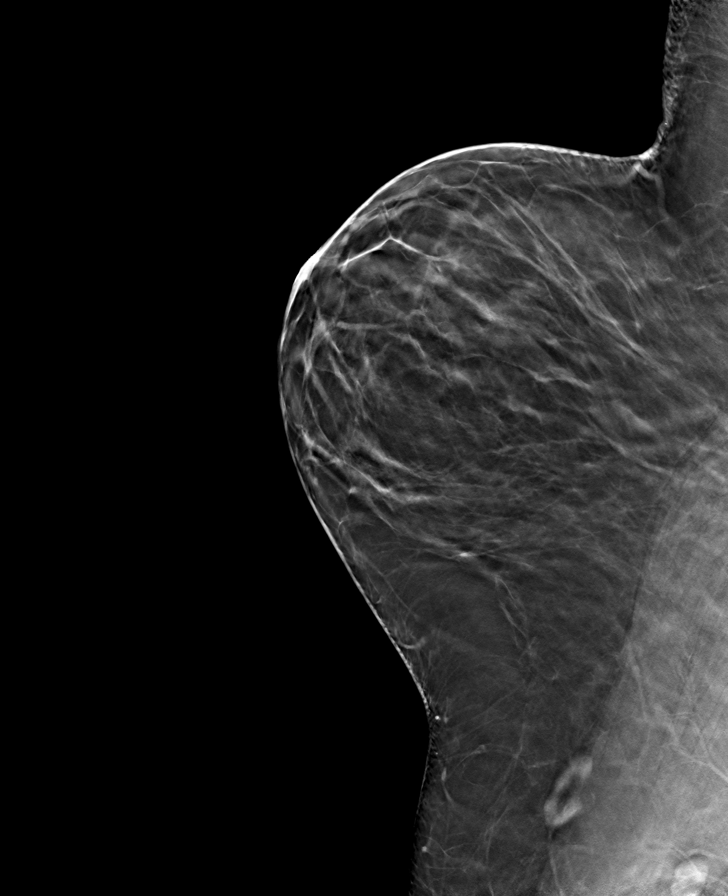

[R MLO tomo · tomo slice 39/78.0]
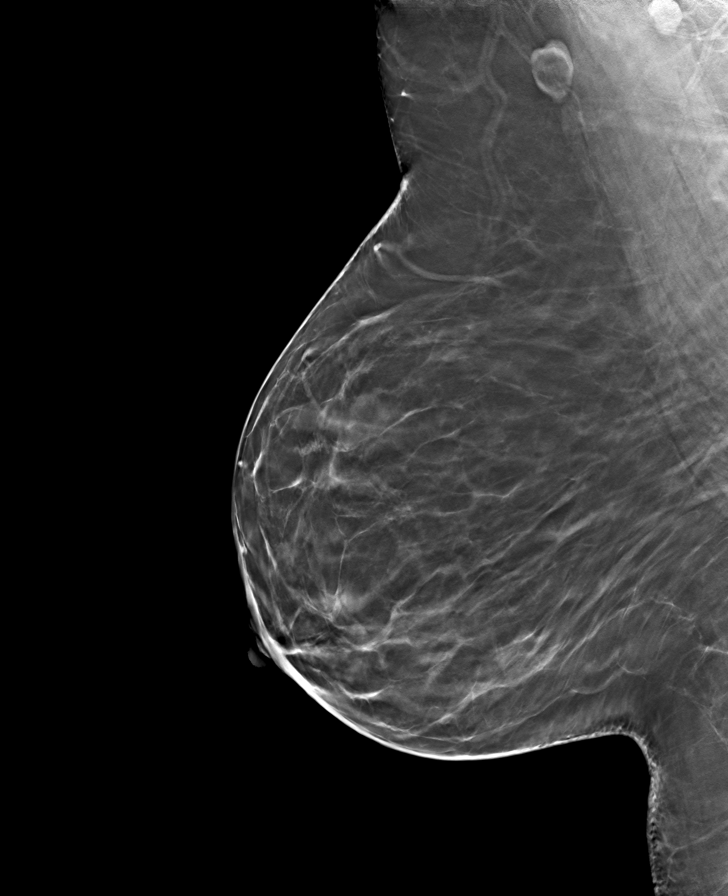

[8 of 24 positions shown; findings below may reference images not displayed]

ACR Breast Density Category b: There are scattered areas of
fibroglandular density.
FINDINGS: There are no findings suspicious for malignancy. The images were
evaluated with computer-aided detection.
IMPRESSION: No mammographic evidence of malignancy. A result letter of this
screening mammogram will be mailed directly to the patient.

RECOMMENDATION:
Screening mammogram in one year. (Code:[OD])

BI-RADS CATEGORY  1: Negative.

## 2021-03-05 ENCOUNTER — Encounter: Payer: Self-pay | Admitting: Family Medicine

## 2021-03-27 ENCOUNTER — Ambulatory Visit: Payer: Managed Care, Other (non HMO) | Admitting: Family Medicine

## 2021-03-28 ENCOUNTER — Other Ambulatory Visit: Payer: Self-pay | Admitting: Family Medicine

## 2021-04-02 ENCOUNTER — Encounter: Payer: Managed Care, Other (non HMO) | Admitting: Family Medicine

## 2021-04-27 ENCOUNTER — Encounter: Payer: Self-pay | Admitting: Family Medicine

## 2021-04-27 ENCOUNTER — Other Ambulatory Visit: Payer: Self-pay

## 2021-04-27 ENCOUNTER — Ambulatory Visit (INDEPENDENT_AMBULATORY_CARE_PROVIDER_SITE_OTHER): Payer: Managed Care, Other (non HMO) | Admitting: Family Medicine

## 2021-04-27 VITALS — BP 113/76 | HR 75 | Temp 98.4°F | Ht 64.5 in | Wt 207.4 lb

## 2021-04-27 DIAGNOSIS — Z0000 Encounter for general adult medical examination without abnormal findings: Secondary | ICD-10-CM

## 2021-04-27 DIAGNOSIS — G4700 Insomnia, unspecified: Secondary | ICD-10-CM

## 2021-04-27 LAB — URINALYSIS, ROUTINE W REFLEX MICROSCOPIC
Bilirubin, UA: NEGATIVE
Glucose, UA: NEGATIVE
Ketones, UA: NEGATIVE
Leukocytes,UA: NEGATIVE
Nitrite, UA: NEGATIVE
Protein,UA: NEGATIVE
Specific Gravity, UA: <1.005 — ABNORMAL LOW (ref 1.005–1.030)
Urobilinogen, Ur: 0.2 mg/dL (ref 0.2–1.0)
pH, UA: 5.5 (ref 5.0–7.5)

## 2021-04-27 LAB — MICROSCOPIC EXAMINATION
Bacteria, UA: NONE SEEN
WBC, UA: NONE SEEN /hpf (ref 0–5)

## 2021-04-27 MED ORDER — TRAZODONE HCL 50 MG PO TABS: 25.0000 mg | ORAL_TABLET | Freq: Every evening | ORAL | 3 refills | Status: DC | PRN

## 2021-04-27 MED ORDER — NAPROXEN 500 MG PO TABS: 500.0000 mg | ORAL_TABLET | Freq: Two times a day (BID) | ORAL | 3 refills | Status: DC

## 2021-04-27 NOTE — Progress Notes (Signed)
BP 113/76   Pulse 75   Temp 98.4 F (36.9 C) (Oral)   Ht 5' 4.5" (1.638 m)   Wt 207 lb 6.4 oz (94.1 kg)   LMP 04/09/2021 (Approximate)   SpO2 98%   BMI 35.05 kg/m    Subjective:    Patient ID: Heidi Taylor, female    DOB: 06/01/1971, 50 y.o.   MRN: [id number]  HPI: Heidi Taylor is a 50 y.o. female presenting on 04/27/2021 for comprehensive medical examination. Current medical complaints include:  INSOMNIA Duration: chronic Satisfied with sleep quality: no Difficulty falling asleep: no Difficulty staying asleep: no Waking a few hours after sleep onset: no Early morning awakenings: no Daytime hypersomnolence: no Wakes feeling refreshed: no Good sleep hygiene: yes Apnea: no Snoring: no Depressed/anxious mood: yes Recent stress: yes Restless legs/nocturnal leg cramps: no Chronic pain/arthritis: no History of sleep study: yes Treatments attempted:  trazodone, melatonin, uinsom, and benadryl   She currently lives with: husband Menopausal Symptoms: no  Depression Screen done today and results listed below:  Depression screen [facility] 2/9 04/27/2021 04/19/2020 11/08/2019 09/13/2019 02/01/2019  Decreased Interest 0 1 1 1 1   Down, Depressed, Hopeless 1 1 1 1 1   PHQ - 2 Score 1 2 2 2 2   Altered sleeping 1 2 3 2  0  Tired, decreased energy 1 2 1 1  0  Change in appetite 1 0 0 1 0  Feeling bad or failure about yourself  0 0 0 0 0  Trouble concentrating 0 0 0 0 0  Moving slowly or fidgety/restless 0 0 0 0 0  Suicidal thoughts 0 0 0 0 0  PHQ-9 Score 4 6 6 6 2   Difficult doing work/chores Not difficult at all Somewhat difficult Somewhat difficult - Not difficult at all  Some recent data might be hidden    Past Medical History:  Past Medical History:  Diagnosis Date   Anxiety    DDD (degenerative disc disease), lumbar     Surgical History:  Past Surgical History:  Procedure Laterality Date   HERNIA REPAIR      Medications:  Current Outpatient Medications on File Prior  to Visit  Medication Sig   cetirizine (ZYRTEC) 10 MG tablet Take 10 mg by mouth daily.   clindamycin (CLEOCIN T) 1 % lotion Apply topically daily as needed.   clonazePAM (KLONOPIN) 0.5 MG tablet Take 0.5-1 tablets (0.25-0.5 mg total) by mouth 2 (two) times daily as needed for anxiety.   cyclobenzaprine (FLEXERIL) 10 MG tablet Take 1 tablet (10 mg total) by mouth at bedtime.   fluticasone (FLONASE) 50 MCG/ACT nasal spray Place into the nose.   gabapentin (NEURONTIN) 300 MG capsule Take by mouth as needed.    No current facility-administered medications on file prior to visit.    Allergies:  Allergies  Allergen Reactions   Duloxetine Other (See Comments)    GI and weight   Sertraline Other (See Comments)    GI and weight   Trintellix [Vortioxetine] Nausea And Vomiting    Social History:  Social History   Socioeconomic History   Marital status: Married    Spouse name: Not on file   Number of children: Not on file   Years of education: Not on file   Highest education level: Not on file  Occupational History   Not on file  Tobacco Use   Smoking status: Former    Types: Cigarettes    Quit date: 09/06/2009    Years since quitting: 37.6  Smokeless tobacco: Never  Vaping Use   Vaping Use: Never used  Substance and Sexual Activity   Alcohol use: Yes    Comment: On occasion   Drug use: No   Sexual activity: Yes    Birth control/protection: None  Other Topics Concern   Not on file  Social History Narrative   Not on file   Social Determinants of Health   Financial Resource Strain: Not on file  Food Insecurity: Not on file  Transportation Needs: Not on file  Physical Activity: Not on file  Stress: Not on file  Social Connections: Not on file  Intimate Partner Violence: Not on file   Social History   Tobacco Use  Smoking Status Former   Types: Cigarettes   Quit date: 09/06/2009   Years since quitting: 11.6  Smokeless Tobacco Never   Social History    Substance and Sexual Activity  Alcohol Use Yes   Comment: On occasion    Family History:  Family History  Problem Relation Age of Onset   Cancer Mother        Brain   Parkinson's disease Father    Parkinson's disease Paternal Grandfather    Breast cancer Maternal Aunt     Past medical history, surgical history, medications, allergies, family history and social history reviewed with patient today and changes made to appropriate areas of the chart.   Review of Systems  Constitutional: Negative.   HENT: Negative.    Eyes: Negative.   Respiratory: Negative.    Cardiovascular: Negative.   Gastrointestinal:  Positive for heartburn. Negative for abdominal pain, blood in stool, constipation, diarrhea, melena, nausea and vomiting.  Genitourinary: Negative.   Musculoskeletal:  Positive for back pain. Negative for falls, joint pain, myalgias and neck pain.  Skin: Negative.   Neurological: Negative.   Endo/Heme/Allergies: Negative.   Psychiatric/Behavioral: Negative.    All other ROS negative except what is listed above and in the HPI.      Objective:    BP 113/76   Pulse 75   Temp 98.4 F (36.9 C) (Oral)   Ht 5' 4.5" (1.638 m)   Wt 207 lb 6.4 oz (94.1 kg)   LMP 04/09/2021 (Approximate)   SpO2 98%   BMI 35.05 kg/m   Wt Readings from Last 3 Encounters:  04/27/21 207 lb 6.4 oz (94.1 kg)  11/06/20 198 lb (89.8 kg)  04/19/20 193 lb 6.4 oz (87.7 kg)    Physical Exam Vitals and nursing note reviewed.  Constitutional:      General: She is not in acute distress.    Appearance: Normal appearance. She is not ill-appearing, toxic-appearing or diaphoretic.  HENT:     Head: Normocephalic and atraumatic.     Right Ear: Tympanic membrane, ear canal and external ear normal. There is no impacted cerumen.     Left Ear: Tympanic membrane, ear canal and external ear normal. There is no impacted cerumen.     Nose: Nose normal. No congestion or rhinorrhea.     Mouth/Throat:     Mouth:  Mucous membranes are moist.     Pharynx: Oropharynx is clear. No oropharyngeal exudate or posterior oropharyngeal erythema.  Eyes:     General: No scleral icterus.       Right eye: No discharge.        Left eye: No discharge.     Extraocular Movements: Extraocular movements intact.     Conjunctiva/sclera: Conjunctivae normal.     Pupils: Pupils are equal, round, and  reactive to light.  Neck:     Vascular: No carotid bruit.  Cardiovascular:     Rate and Rhythm: Normal rate and regular rhythm.     Pulses: Normal pulses.     Heart sounds: No murmur heard.   No friction rub. No gallop.  Pulmonary:     Effort: Pulmonary effort is normal. No respiratory distress.     Breath sounds: Normal breath sounds. No stridor. No wheezing, rhonchi or rales.  Chest:     Chest wall: No tenderness.  Abdominal:     General: Abdomen is flat. Bowel sounds are normal. There is no distension.     Palpations: Abdomen is soft. There is no mass.     Tenderness: There is no abdominal tenderness. There is no right CVA tenderness, left CVA tenderness, guarding or rebound.     Hernia: No hernia is present.  Genitourinary:    Comments: Breast and pelvic exams deferred with shared decision making Musculoskeletal:        General: No swelling, tenderness, deformity or signs of injury.     Cervical back: Normal range of motion and neck supple. No rigidity. No muscular tenderness.     Right lower leg: No edema.     Left lower leg: No edema.  Lymphadenopathy:     Cervical: No cervical adenopathy.  Skin:    General: Skin is warm and dry.     Capillary Refill: Capillary refill takes less than 2 seconds.     Coloration: Skin is not jaundiced or pale.     Findings: No bruising, erythema, lesion or rash.  Neurological:     General: No focal deficit present.     Mental Status: She is alert and oriented to person, place, and time. Mental status is at baseline.     Cranial Nerves: No cranial nerve deficit.     Sensory:  No sensory deficit.     Motor: No weakness.     Coordination: Coordination normal.     Gait: Gait normal.     Deep Tendon Reflexes: Reflexes normal.  Psychiatric:        Mood and Affect: Mood normal.        Behavior: Behavior normal.        Thought Content: Thought content normal.        Judgment: Judgment normal.    Results for orders placed or performed in visit on 05/19/20  Cologuard  Result Value Ref Range   Cologuard Negative Negative      Assessment & Plan:   Problem List Items Addressed This Visit   None Visit Diagnoses     Routine general medical examination at a health care facility    -  Primary   Vaccines up to date. Screening labs checked today. Pap, mammo and cologuard up to date. Continue diet and exercise. Call with any concerns. Continue to monitor.   Relevant Orders   CBC with Differential/Platelet   Comprehensive metabolic panel   Lipid Panel w/o Chol/HDL Ratio   Urinalysis, Routine w reflex microscopic   TSH   Insomnia, unspecified type       Refill of trazodone given. Continue to monitor.         Follow up plan: Return in about 1 year (around 04/27/2022) for physical.   LABORATORY TESTING:  - Pap smear: up to date  IMMUNIZATIONS:   - Tdap: Tetanus vaccination status reviewed: last tetanus booster within 10 years. - Influenza: Postponed to flu season - Pneumovax: Not  applicable - Prevnar: Not applicable - COVID: Up to date  SCREENING: -Mammogram: Up to date  - Colonoscopy: Up to date   PATIENT COUNSELING:   Advised to take 1 mg of folate supplement per day if capable of pregnancy.   Sexuality: Discussed sexually transmitted diseases, partner selection, use of condoms, avoidance of unintended pregnancy  and contraceptive alternatives.   Advised to avoid cigarette smoking.  I discussed with the patient that most people either abstain from alcohol or drink within safe limits (<=14/week and <=4 drinks/occasion for males, <=7/weeks and <= 3  drinks/occasion for females) and that the risk for alcohol disorders and other health effects rises proportionally with the number of drinks per week and how often a drinker exceeds daily limits.  Discussed cessation/primary prevention of drug use and availability of treatment for abuse.   Diet: Encouraged to adjust caloric intake to maintain  or achieve ideal body weight, to reduce intake of dietary saturated fat and total fat, to limit sodium intake by avoiding high sodium foods and not adding table salt, and to maintain adequate dietary potassium and calcium preferably from fresh fruits, vegetables, and low-fat dairy products.    stressed the importance of regular exercise  Injury prevention: Discussed safety belts, safety helmets, smoke detector, smoking near bedding or upholstery.   Dental health: Discussed importance of regular tooth brushing, flossing, and dental visits.    NEXT PREVENTATIVE PHYSICAL DUE IN 1 YEAR. Return in about 1 year (around 04/27/2022) for physical.

## 2021-04-28 LAB — CBC WITH DIFFERENTIAL/PLATELET
Basophils Absolute: 0.1 10*3/uL (ref 0.0–0.2)
Basos: 1 %
EOS (ABSOLUTE): 0.1 10*3/uL (ref 0.0–0.4)
Eos: 1 %
Hematocrit: 46.5 % (ref 34.0–46.6)
Hemoglobin: 14.8 g/dL (ref 11.1–15.9)
Immature Grans (Abs): 0 10*3/uL (ref 0.0–0.1)
Immature Granulocytes: 0 %
Lymphocytes Absolute: 2.5 10*3/uL (ref 0.7–3.1)
Lymphs: 27 %
MCH: 27.7 pg (ref 26.6–33.0)
MCHC: 31.8 g/dL (ref 31.5–35.7)
MCV: 87 fL (ref 79–97)
Monocytes Absolute: 0.7 10*3/uL (ref 0.1–0.9)
Monocytes: 7 %
Neutrophils Absolute: 5.7 10*3/uL (ref 1.4–7.0)
Neutrophils: 64 %
Platelets: 434 10*3/uL (ref 150–450)
RBC: 5.35 x10E6/uL — ABNORMAL HIGH (ref 3.77–5.28)
RDW: 13.3 % (ref 11.7–15.4)
WBC: 9.1 10*3/uL (ref 3.4–10.8)

## 2021-04-28 LAB — COMPREHENSIVE METABOLIC PANEL
ALT: 13 IU/L (ref 0–32)
AST: 20 IU/L (ref 0–40)
Albumin/Globulin Ratio: 1.6 (ref 1.2–2.2)
Albumin: 4.4 g/dL (ref 3.8–4.8)
Alkaline Phosphatase: 63 IU/L (ref 44–121)
BUN/Creatinine Ratio: 15 (ref 9–23)
BUN: 14 mg/dL (ref 6–24)
Bilirubin Total: 0.6 mg/dL (ref 0.0–1.2)
CO2: 20 mmol/L (ref 20–29)
Calcium: 9.7 mg/dL (ref 8.7–10.2)
Chloride: 103 mmol/L (ref 96–106)
Creatinine, Ser: 0.95 mg/dL (ref 0.57–1.00)
Globulin, Total: 2.7 g/dL (ref 1.5–4.5)
Glucose: 105 mg/dL — ABNORMAL HIGH (ref 65–99)
Potassium: 4.6 mmol/L (ref 3.5–5.2)
Sodium: 139 mmol/L (ref 134–144)
Total Protein: 7.1 g/dL (ref 6.0–8.5)
eGFR: 73 mL/min/{1.73_m2} (ref >59)

## 2021-04-28 LAB — LIPID PANEL W/O CHOL/HDL RATIO
Cholesterol, Total: 202 mg/dL — ABNORMAL HIGH (ref 100–199)
HDL: 50 mg/dL (ref >39)
LDL Chol Calc (NIH): 136 mg/dL — ABNORMAL HIGH (ref 0–99)
Triglycerides: 88 mg/dL (ref 0–149)
VLDL Cholesterol Cal: 16 mg/dL (ref 5–40)

## 2021-04-28 LAB — TSH: TSH: 1.9 u[IU]/mL (ref 0.450–4.500)

## 2021-04-30 NOTE — Telephone Encounter (Signed)
Ready to be completed

## 2021-05-19 ENCOUNTER — Other Ambulatory Visit: Payer: Self-pay | Admitting: Family Medicine

## 2021-05-20 NOTE — Telephone Encounter (Signed)
Requested medication (s) are due for refill today: yes  Requested medication (s) are on the active medication list: yes  Last refill:  01/04/21  Future visit scheduled: yes  Notes to clinic:  med not delegated to NT to RF   Requested Prescriptions  Pending Prescriptions Disp Refills   cyclobenzaprine (FLEXERIL) 10 MG tablet [Pharmacy Med Name: CYCLOBENZAPRINE 10MG TABLETS] 30 tablet 0    Sig: TAKE 1 TABLET(10 MG) BY MOUTH AT BEDTIME     Not Delegated - Analgesics:  Muscle Relaxants Failed - 05/19/2021  8:19 PM      Failed - This refill cannot be delegated      Passed - Valid encounter within last 6 months    Recent Outpatient Visits           3 weeks ago Routine general medical examination at a health care facility   [facility], [place] P, DO   4 months ago DDD (degenerative disc disease), lumbosacral   [place], [name], DO   6 months ago Viral upper respiratory tract infection   [place], [place], DO   1 year ago Routine general medical examination at a health care facility   [place], [place] P, DO   1 year ago Generalized anxiety disorder   [facility] Valerie Roys, DO       Future Appointments             In 11 months Wynetta Emery, Barb Merino, DO [employer]

## 2021-05-21 NOTE — Telephone Encounter (Signed)
Patient last seen 04/27/21

## 2021-05-25 ENCOUNTER — Other Ambulatory Visit: Payer: Self-pay | Admitting: Physical Medicine & Rehabilitation

## 2021-05-25 DIAGNOSIS — M542 Cervicalgia: Secondary | ICD-10-CM

## 2021-06-01 ENCOUNTER — Ambulatory Visit: Admission: RE | Admit: 2021-06-01 | Payer: Managed Care, Other (non HMO) | Source: Ambulatory Visit

## 2021-06-11 ENCOUNTER — Ambulatory Visit
Admission: RE | Admit: 2021-06-11 | Discharge: 2021-06-11 | Disposition: A | Discharge status: Home / Self Care | Payer: Managed Care, Other (non HMO) | Source: Ambulatory Visit | Attending: Physical Medicine & Rehabilitation | Admitting: Physical Medicine & Rehabilitation

## 2021-06-11 ENCOUNTER — Other Ambulatory Visit: Payer: Self-pay

## 2021-06-11 DIAGNOSIS — M542 Cervicalgia: Secondary | ICD-10-CM | POA: Insufficient documentation

## 2021-06-11 IMAGING — MR MR CERVICAL SPINE W/O CM
5 series · 35 of 48 positions shown · non-contrast
Comparison: No pertinent prior exams available for comparison.

CLINICAL DATA: Cervicalgia. Additional history provided by scanning
technologist: Patient reports right arm weakness for 2-3 weeks.

EXAM:
MRI CERVICAL SPINE WITHOUT CONTRAST
TECHNIQUE: Multiplanar, multisequence MR imaging of the cervical spine was
performed. No intravenous contrast was administered.

[Series 5: T2 · sagittal · 3.0mm · 0.62mm/px · 6 of 15 slices shown (1 of 2)]
[im 1/15]
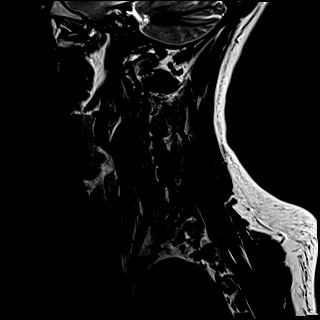
[im 3/15]
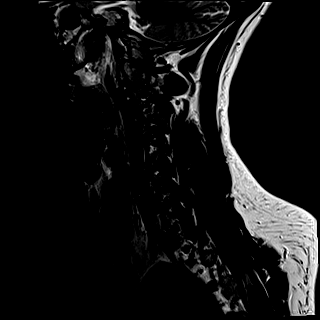
[im 6/15]
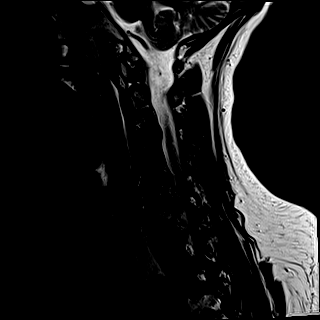
[im 9/15]
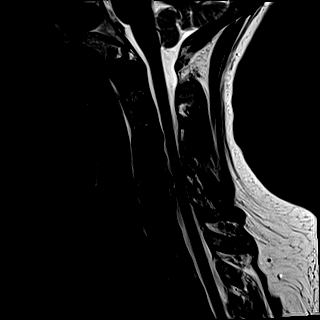
[im 12/15]
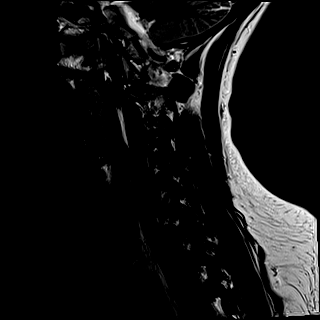
[im 15/15]
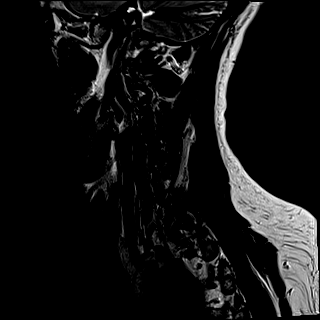

[Series 6: FLAIR · sagittal · 3.0mm · 0.78mm/px · 7 of 15 slices shown]
[im 1/15]
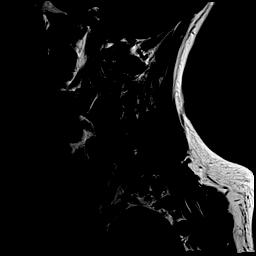
[im 3/15]
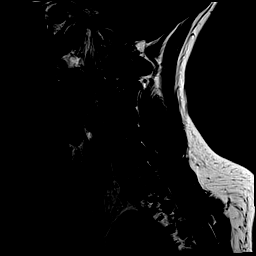
[im 5/15]
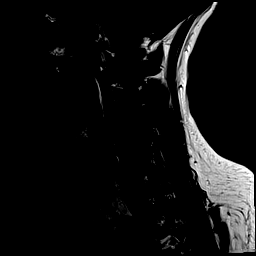
[im 8/15]
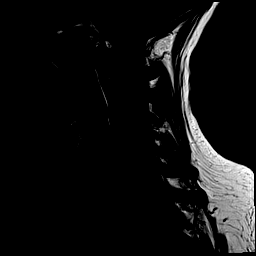
[im 10/15]
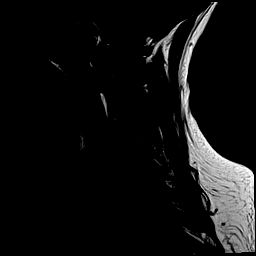
[im 12/15]
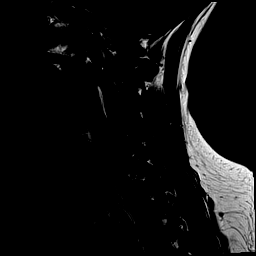
[im 15/15]
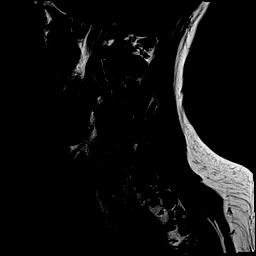

[Series 7: STIR · sagittal · 3.0mm · 0.62mm/px · 7 of 15 slices shown]
[im 1/15]
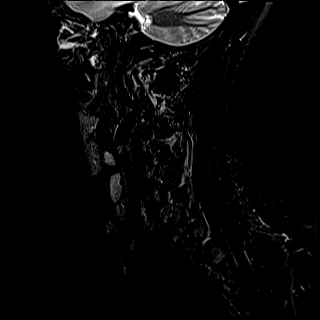
[im 3/15]
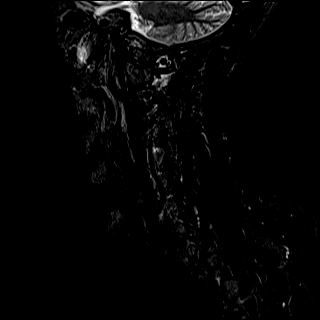
[im 5/15]
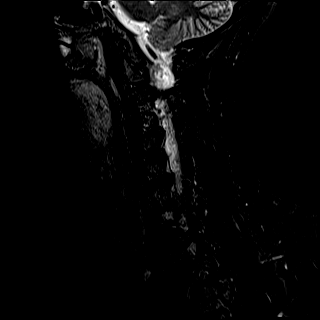
[im 8/15]
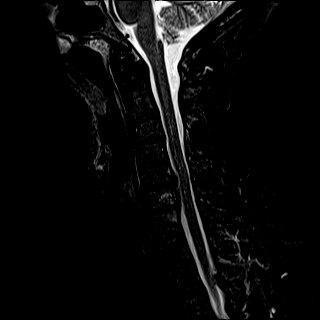
[im 10/15]
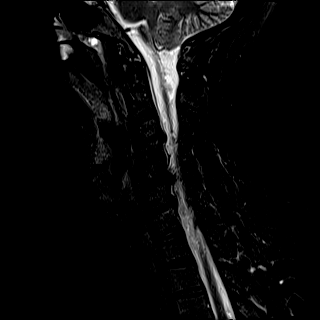
[im 12/15]
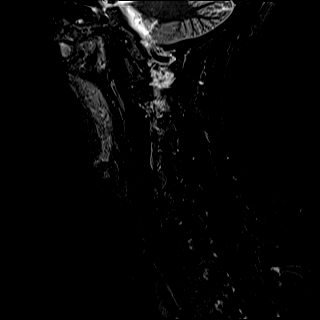
[im 15/15]
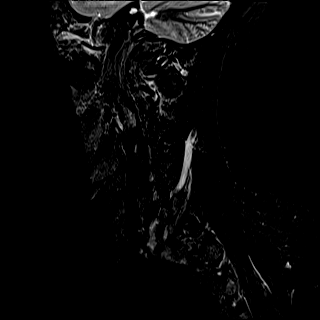

[Series 8: T2 · axial · 3.0mm · 0.70mm/px · z∈[-50,+47]mm · 8 of 29 slices shown (2 of 2)]
[im 1/29]
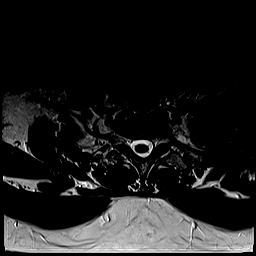
[im 5/29]
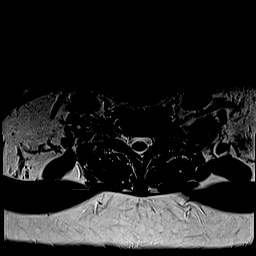
[im 9/29]
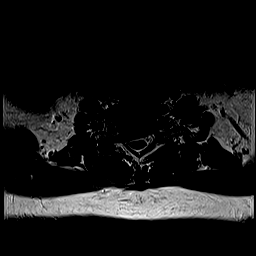
[im 13/29]
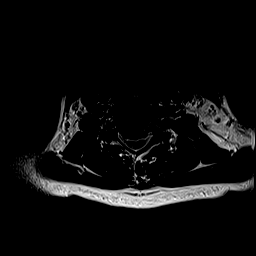
[im 16/29]
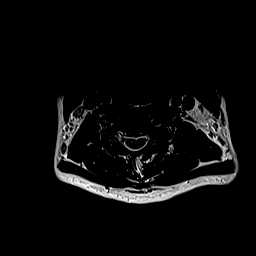
[im 20/29]
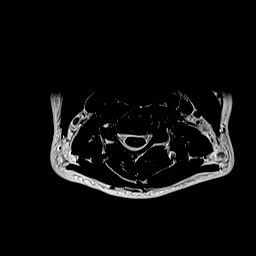
[im 24/29]
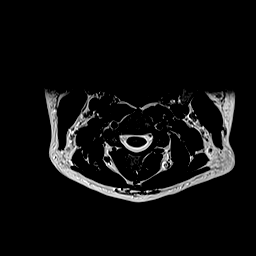
[im 29/29]
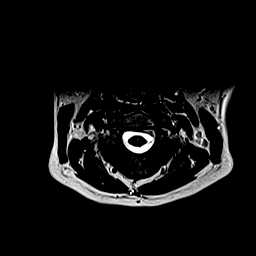

[Series 9: ax mpgr · axial · 3.0mm · 0.35mm/px · z∈[-50,+30]mm · 7 of 29 slices shown]
[im 1/29]
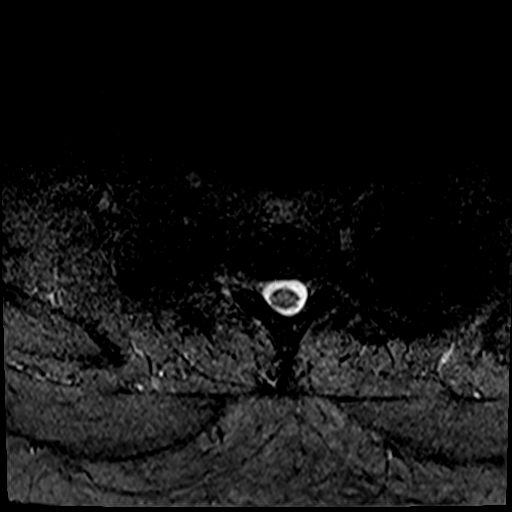
[im 5/29]
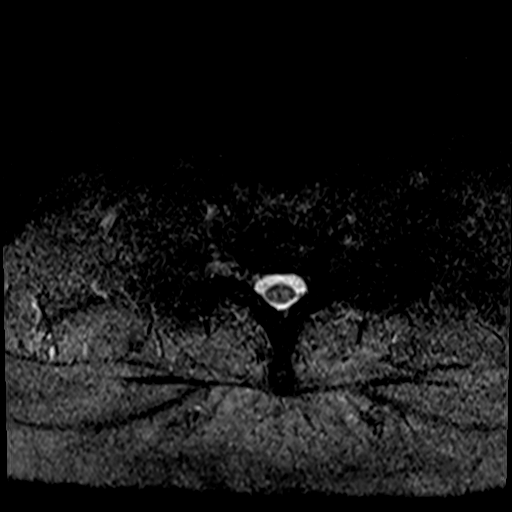
[im 9/29]
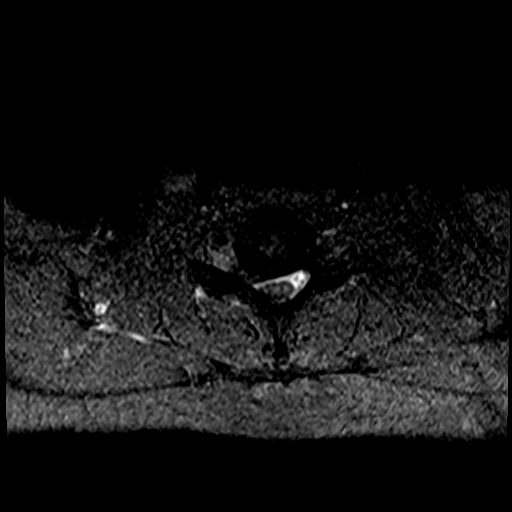
[im 13/29]
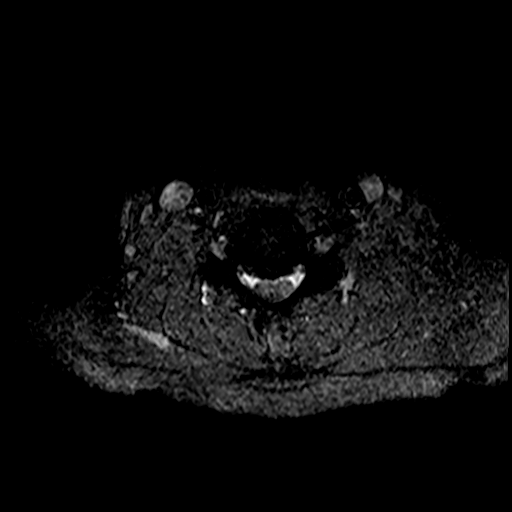
[im 16/29]
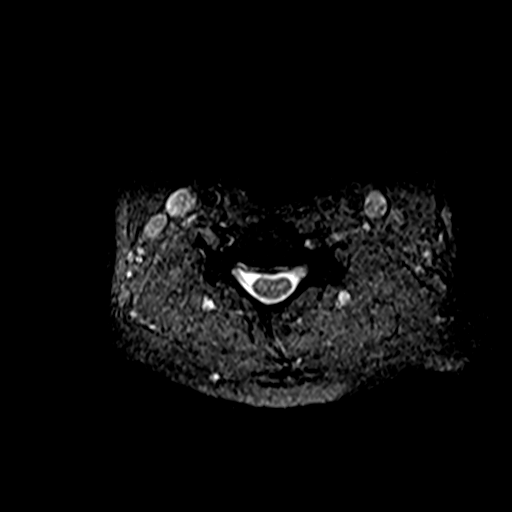
[im 20/29]
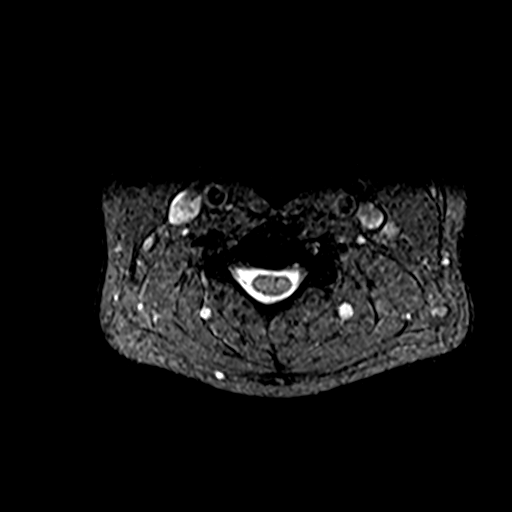
[im 24/29]
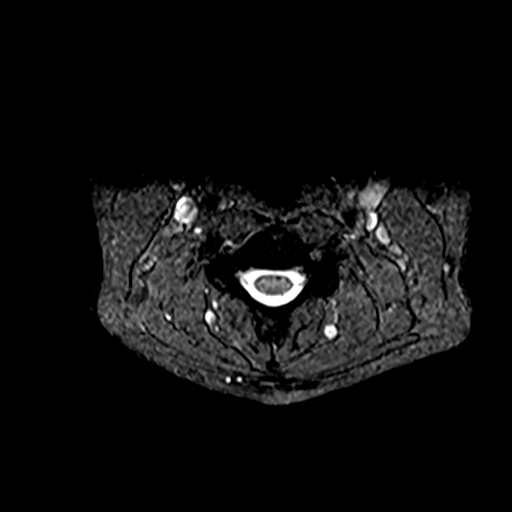

[35 of 48 positions shown; findings below may reference images not displayed]

FINDINGS: Alignment: Straightening of the expected cervical lordosis. Mild
cervical dextrocurvature. No significant spondylolisthesis.

Vertebrae: Vertebral body height is maintained. Mild degenerative
endplate irregularity at C5-C6 and C6-C7. Mild degenerative endplate
edema at C6-C7. Elsewhere, no significant marrow edema or focal
suspicious osseous lesion is identified.

Cord: No spinal cord signal abnormality is identified. Mild
flattening of the ventral spinal cord at C5-C6, as described below.

Posterior Fossa, vertebral arteries, paraspinal tissues: No
abnormality identified within included portions of the posterior
fossa. Flow voids preserved within the imaged cervical vertebral
arteries. Paraspinal soft tissues unremarkable.

Disc levels:

Moderate disc degeneration at C5-C6 and C6-C7. No more than mild
disc degeneration at the remaining levels.

C2-C3: No significant disc herniation or stenosis.

C3-C4: Shallow disc bulge. Right-sided disc osteophyte
ridge/uncinate hypertrophy. Uncinate hypertrophy also present on the
left. No significant spinal canal stenosis. Mild relative right
neural foraminal narrowing.

C4-C5: Shallow disc bulge. Bilateral uncovertebral hypertrophy. Mild
facet arthrosis (predominantly on the left). No significant spinal
canal stenosis. Mild relative left neural foraminal narrowing.

C5-C6: Posterior disc osteophyte complex with bilateral disc
osteophyte ridge/uncinate hypertrophy. Mild facet arthrosis. Mild
spinal canal stenosis. The posterior disc osteophyte complex
contacts and minimally flattens the ventral spinal cord. Bilateral
neural foraminal narrowing (mild right, moderate/severe left).

C6-C7: Disc bulge. Superimposed right center/foraminal disc
extrusion (for instance as seen on series 5, images 5 and 6).
Uncovertebral hypertrophy (greater on the right). Mild spinal canal
stenosis with effacement of the right greater than left ventral
thecal sac (but without spinal cord mass effect). The disc extrusion
encroaches upon the exiting right C7 nerve root within the right
foraminal entry zone. No significant left foraminal stenosis.

C7-T1: Mild facet arthrosis on the left. No significant disc
herniation or stenosis.
IMPRESSION: Cervical spondylosis, as outlined and with findings most notably as
follows.

At C6-C7, there is moderate disc degeneration. Disc bulge.
Superimposed right center/foraminal disc extrusion. Uncovertebral
hypertrophy (greater on the right). Mild spinal canal stenosis with
effacement of the right greater than left ventral thecal sac
(without spinal cord mass effect). The disc extrusion encroaches
upon the exiting right C7 nerve root within the right foraminal
entry zone. Correlate for right C7 radiculopathy. Mild degenerative
endplate edema also present at this level.

At C5-C6, there is moderate disc degeneration. Posterior disc
osteophyte complex with bilateral disc osteophyte ridge/uncinate
hypertrophy. Mild facet arthrosis. Mild spinal canal stenosis. The
posterior disc osteophyte complex contacts and minimally flattens
the ventral spinal cord. Bilateral neural foraminal narrowing (mild
right, moderate/severe left).

No significant spinal canal stenosis at the remaining levels.
Additional sites of mild relative foraminal narrowing, as detailed.

Nonspecific straightening of the expected cervical lordosis.

## 2021-11-02 ENCOUNTER — Other Ambulatory Visit: Payer: Self-pay | Admitting: Family Medicine

## 2021-11-02 ENCOUNTER — Other Ambulatory Visit: Payer: Self-pay | Admitting: Nurse Practitioner

## 2021-11-02 NOTE — Telephone Encounter (Signed)
Pt scheduled 2/15

## 2021-11-02 NOTE — Telephone Encounter (Signed)
Needs appt for this one

## 2021-11-02 NOTE — Telephone Encounter (Signed)
Requested medication (s) are due for refill today: yes  Requested medication (s) are on the active medication list: yes    Last refill: 09/06/20 #90  0 refills  Future visit scheduled yes 04/29/22  Notes to clinic:Not delegated, please review.  Requested Prescriptions  Pending Prescriptions Disp Refills   clonazePAM (KLONOPIN) 0.5 MG tablet [Pharmacy Med Name: CLONAZEPAM 0.5MG TABLETS] 90 tablet     Sig: TAKE 1/2 TO 1 TABLET(0.25 TO 0.5 MG) BY MOUTH TWICE DAILY AS NEEDED FOR ANXIETY     Not Delegated - Psychiatry: Anxiolytics/Hypnotics 2 Failed - 11/02/2021  9:03 AM      Failed - This refill cannot be delegated      Failed - Urine Drug Screen completed in last 360 days      Failed - Valid encounter within last 6 months    Recent Outpatient Visits           6 months ago Routine general medical examination at a health care facility   [facility], [place] P, DO   9 months ago DDD (degenerative disc disease), lumbosacral   [facility] [place], [name], DO   12 months ago Viral upper respiratory tract infection   [place], [place], DO   1 year ago Routine general medical examination at a health care facility   [place], [place] P, DO   1 year ago Generalized anxiety disorder   [facility] Valerie Roys, DO       Future Appointments             In 5 months Wynetta Emery, Barb Merino, DO [place], [place] - Patient is not pregnant

## 2021-11-02 NOTE — Telephone Encounter (Signed)
Requested medication (s) are due for refill today: yes  Requested medication (s) are on the active medication list: yes    Last refill: 05/21/21  #30  0 refills  Future visit scheduled yes 05/09/22  Notes to clinic:Not delegated  Requested Prescriptions  Pending Prescriptions Disp Refills   cyclobenzaprine (FLEXERIL) 10 MG tablet [Pharmacy Med Name: CYCLOBENZAPRINE 10MG TABLETS] 30 tablet 0    Sig: TAKE 1 TABLET(10 MG) BY MOUTH AT BEDTIME     Not Delegated - Analgesics:  Muscle Relaxants Failed - 11/02/2021  9:02 AM      Failed - This refill cannot be delegated      Failed - Valid encounter within last 6 months    Recent Outpatient Visits           6 months ago Routine general medical examination at a health care facility   [facility], [place] P, DO   9 months ago DDD (degenerative disc disease), lumbosacral   [place], [name], DO   12 months ago Viral upper respiratory tract infection   [place], [place], DO   1 year ago Routine general medical examination at a health care facility   [place], [place] P, DO   1 year ago Generalized anxiety disorder   [facility] Valerie Roys, DO       Future Appointments             In 5 months Wynetta Emery, Barb Merino, DO [employer]

## 2021-11-07 ENCOUNTER — Ambulatory Visit: Payer: Managed Care, Other (non HMO) | Admitting: Family Medicine

## 2021-11-07 ENCOUNTER — Encounter: Payer: Self-pay | Admitting: Family Medicine

## 2021-11-07 ENCOUNTER — Other Ambulatory Visit: Payer: Self-pay

## 2021-11-07 VITALS — BP 124/79 | HR 80 | Temp 98.7°F | Wt 199.6 lb

## 2021-11-07 DIAGNOSIS — N951 Menopausal and female climacteric states: Secondary | ICD-10-CM

## 2021-11-07 DIAGNOSIS — F411 Generalized anxiety disorder: Secondary | ICD-10-CM | POA: Diagnosis not present

## 2021-11-07 MED ORDER — TRAZODONE HCL 50 MG PO TABS: 50.0000 mg | ORAL_TABLET | Freq: Every evening | ORAL | 3 refills | Status: DC | PRN

## 2021-11-07 MED ORDER — CLONAZEPAM 0.5 MG PO TABS: 0.2500 mg | ORAL_TABLET | Freq: Two times a day (BID) | ORAL | 2 refills | Status: DC | PRN

## 2021-11-07 NOTE — Assessment & Plan Note (Signed)
Under good control on current regimen. Continue current regimen. Continue to monitor. Call with any concerns. Refills given.   

## 2021-11-07 NOTE — Patient Instructions (Signed)
Estroven Black cohash Evening Primrose Oil

## 2021-11-07 NOTE — Progress Notes (Signed)
BP 124/79    Pulse 80    Temp 98.7 F (37.1 C) (Oral)    Wt 199 lb 9.6 oz (90.5 kg)    LMP  (LMP Unknown)    SpO2 98%    BMI 33.73 kg/m    Subjective:    Patient ID: Heidi Taylor, female    DOB: 1971-05-02, 51 y.o.   MRN: [id number]  HPI: Heidi Taylor is a 51 y.o. female  Chief Complaint  Patient presents with   Anxiety   MENOPAUSAL SYMPTOMS Duration: worse in the past couple of weeks Symptom severity: moderate Hot flashes: yes Night sweats: yes Sleep disturbances: yes Vaginal dryness: no Dyspareunia:no Decreased libido: yes Emotional lability: yes Stress incontinence: yes Previous HRT/pharmacotherapy: no Hysterectomy: no Average interval between menses: no period in about 6 months Absolute Contraindications to Hormonal Therapy:     Undiagnosed vaginal bleeding: no    Breast cancer: family history    Endometrial cancer: no    Coronary disease: no    Cerebrovascular disease: no    Venous thromboembolic disease: no  ANXIETY/STRESS Duration: chronic Status:controlled Anxious mood: yes  Excessive worrying: no Irritability: no  Sweating: no Nausea: no Palpitations:no Hyperventilation: no Panic attacks: no Agoraphobia: no  Obscessions/compulsions: no Depressed mood: yes Depression screen [facility] 2/9 11/07/2021 04/27/2021 04/19/2020 11/08/2019 09/13/2019  Decreased Interest 1 0 _0 Down, Depressed, Hopeless _1 PHQ - 2 Score _2 Altered sleeping _3 Tired, decreased energy _4 Change in appetite 1 1 0 0 1  Feeling bad or failure about yourself  0 0 0 0 0  Trouble concentrating 0 0 0 0 0  Moving slowly or fidgety/restless 0 0 0 0 0  Suicidal thoughts 0 0 0 0 0  PHQ-9 Score _5 Difficult doing work/chores Somewhat difficult Not difficult at all Somewhat difficult Somewhat difficult -  Some recent data might be hidden   GAD 7 : Generalized Anxiety Score 11/07/2021 04/27/2021 04/19/2020 11/08/2019  Nervous, Anxious, on Edge _6 0  Control/stop worrying 0 0 0 0  Worry too much - different things 0 0 0 0  Trouble relaxing _7 0  Restless 0 0 0 0  Easily annoyed or irritable 0 1 1 0  Afraid - awful might happen 0 0 0 0  Total GAD 7 Score _8 0  Anxiety Difficulty Somewhat difficult Not difficult at all Somewhat difficult Not difficult at all   Anhedonia: no Weight changes: no Insomnia: yes hard to stay asleep  Hypersomnia: no Fatigue/loss of energy: yes Feelings of worthlessness: no Feelings of guilt: no Impaired concentration/indecisiveness: no Suicidal ideations: no  Crying spells: no Recent Stressors/Life Changes: yes   Relationship problems: no   Family stress: no     Financial stress: no    Job stress: no    Recent death/loss: no   Relevant past medical, surgical, family and social history reviewed and updated as indicated. Interim medical history since our last visit reviewed. Allergies and medications reviewed and updated.  Review of Systems  Constitutional: Negative.   Respiratory: Negative.    Cardiovascular: Negative.   Gastrointestinal: Negative.   Musculoskeletal: Negative.   Psychiatric/Behavioral: Negative.     Per HPI unless specifically indicated above     Objective:    BP 124/79    Pulse 80  Temp 98.7 F (37.1 C) (Oral)    Wt 199 lb 9.6 oz (90.5 kg)    LMP  (LMP Unknown)    SpO2 98%    BMI 33.73 kg/m   Wt Readings from Last 3 Encounters:  11/07/21 199 lb 9.6 oz (90.5 kg)  04/27/21 207 lb 6.4 oz (94.1 kg)  11/06/20 198 lb (89.8 kg)    Physical Exam Vitals and nursing note reviewed.  Constitutional:      General: She is not in acute distress.    Appearance: Normal appearance. She is not ill-appearing, toxic-appearing or diaphoretic.  HENT:     Head: Normocephalic and atraumatic.     Right Ear: External ear normal.     Left Ear: External ear normal.     Nose: Nose normal.     Mouth/Throat:     Mouth: Mucous membranes are moist.     Pharynx: Oropharynx is  clear.  Eyes:     General: No scleral icterus.       Right eye: No discharge.        Left eye: No discharge.     Extraocular Movements: Extraocular movements intact.     Conjunctiva/sclera: Conjunctivae normal.     Pupils: Pupils are equal, round, and reactive to light.  Cardiovascular:     Rate and Rhythm: Normal rate and regular rhythm.     Pulses: Normal pulses.     Heart sounds: Normal heart sounds. No murmur heard.   No friction rub. No gallop.  Pulmonary:     Effort: Pulmonary effort is normal. No respiratory distress.     Breath sounds: Normal breath sounds. No stridor. No wheezing, rhonchi or rales.  Chest:     Chest wall: No tenderness.  Musculoskeletal:        General: Normal range of motion.     Cervical back: Normal range of motion and neck supple.  Skin:    General: Skin is warm and dry.     Capillary Refill: Capillary refill takes less than 2 seconds.     Coloration: Skin is not jaundiced or pale.     Findings: No bruising, erythema, lesion or rash.  Neurological:     General: No focal deficit present.     Mental Status: She is alert and oriented to person, place, and time. Mental status is at baseline.  Psychiatric:        Mood and Affect: Mood normal.        Behavior: Behavior normal.        Thought Content: Thought content normal.        Judgment: Judgment normal.    Results for orders placed or performed in visit on 04/27/21  Microscopic Examination   Urine  Result Value Ref Range   WBC, UA None seen 0 - 5 /hpf   RBC 0-2 0 - 2 /hpf   Epithelial Cells (non renal) 0-10 0 - 10 /hpf   Bacteria, UA None seen None seen/Few  CBC with Differential/Platelet  Result Value Ref Range   WBC 9.1 3.4 - 10.8 x10E3/uL   RBC 5.35 (H) 3.77 - 5.28 x10E6/uL   Hemoglobin 14.8 11.1 - 15.9 g/dL   Hematocrit 46.5 34.0 - 46.6 %   MCV 87 79 - 97 fL   MCH 27.7 26.6 - 33.0 pg   MCHC 31.8 31.5 - 35.7 g/dL   RDW 13.3 11.7 - 15.4 %   Platelets 434 150 - 450 x10E3/uL    Neutrophils 64 Not Estab. %  Lymphs 27 Not Estab. %   Monocytes 7 Not Estab. %   Eos 1 Not Estab. %   Basos 1 Not Estab. %   Neutrophils Absolute 5.7 1.4 - 7.0 x10E3/uL   Lymphocytes Absolute 2.5 0.7 - 3.1 x10E3/uL   Monocytes Absolute 0.7 0.1 - 0.9 x10E3/uL   EOS (ABSOLUTE) 0.1 0.0 - 0.4 x10E3/uL   Basophils Absolute 0.1 0.0 - 0.2 x10E3/uL   Immature Granulocytes 0 Not Estab. %   Immature Grans (Abs) 0.0 0.0 - 0.1 x10E3/uL  Comprehensive metabolic panel  Result Value Ref Range   Glucose 105 (H) 65 - 99 mg/dL   BUN 14 6 - 24 mg/dL   Creatinine, Ser 0.95 0.57 - 1.00 mg/dL   eGFR 73 >59 mL/min/1.73   BUN/Creatinine Ratio 15 9 - 23   Sodium 139 134 - 144 mmol/L   Potassium 4.6 3.5 - 5.2 mmol/L   Chloride 103 96 - 106 mmol/L   CO2 20 20 - 29 mmol/L   Calcium 9.7 8.7 - 10.2 mg/dL   Total Protein 7.1 6.0 - 8.5 g/dL   Albumin 4.4 3.8 - 4.8 g/dL   Globulin, Total 2.7 1.5 - 4.5 g/dL   Albumin/Globulin Ratio 1.6 1.2 - 2.2   Bilirubin Total 0.6 0.0 - 1.2 mg/dL   Alkaline Phosphatase 63 44 - 121 IU/L   AST 20 0 - 40 IU/L   ALT 13 0 - 32 IU/L  Lipid Panel w/o Chol/HDL Ratio  Result Value Ref Range   Cholesterol, Total 202 (H) 100 - 199 mg/dL   Triglycerides 88 0 - 149 mg/dL   HDL 50 >39 mg/dL   VLDL Cholesterol Cal 16 5 - 40 mg/dL   LDL Chol Calc (NIH) 136 (H) 0 - 99 mg/dL  Urinalysis, Routine w reflex microscopic  Result Value Ref Range   Specific Gravity, UA <1.005 (L) 1.005 - 1.030   pH, UA 5.5 5.0 - 7.5   Color, UA Yellow Yellow   Appearance Ur Clear Clear   Leukocytes,UA Negative Negative   Protein,UA Negative Negative/Trace   Glucose, UA Negative Negative   Ketones, UA Negative Negative   RBC, UA Trace (A) Negative   Bilirubin, UA Negative Negative   Urobilinogen, Ur 0.2 0.2 - 1.0 mg/dL   Nitrite, UA Negative Negative   Microscopic Examination See below:   TSH  Result Value Ref Range   TSH 1.900 0.450 - 4.500 uIU/mL      Assessment & Plan:   Problem List Items  Addressed This Visit       Other   Anxiety disorder    Under good control on current regimen. Continue current regimen. Continue to monitor. Call with any concerns. Refills given.        Relevant Medications   traZODone (DESYREL) 50 MG tablet   Other Visit Diagnoses     Perimenopause    -  Primary   Will try supplements and check labs. If not getting better, will let us know. Call with any concerns.    Relevant Orders   LH   [place]   Estradiol   TSH        Follow up plan: Return in about 6 months (around 05/07/2022) for physical.

## 2021-11-08 LAB — TSH: TSH: 2.12 u[IU]/mL (ref 0.450–4.500)

## 2021-11-08 LAB — LUTEINIZING HORMONE: LH: 50.8 m[IU]/mL

## 2021-11-08 LAB — FOLLICLE STIMULATING HORMONE: FSH: 67.8 m[IU]/mL

## 2021-11-08 LAB — ESTRADIOL: Estradiol: 34.8 pg/mL

## 2021-12-07 ENCOUNTER — Encounter: Payer: Self-pay | Admitting: Family Medicine

## 2022-04-23 ENCOUNTER — Ambulatory Visit
Admission: RE | Admit: 2022-04-23 | Discharge: 2022-04-23 | Disposition: A | Discharge status: Home / Self Care | Payer: Managed Care, Other (non HMO) | Source: Ambulatory Visit | Attending: Physical Medicine & Rehabilitation | Admitting: Physical Medicine & Rehabilitation

## 2022-04-23 ENCOUNTER — Other Ambulatory Visit: Payer: Self-pay | Admitting: Physical Medicine & Rehabilitation

## 2022-04-23 DIAGNOSIS — M5442 Lumbago with sciatica, left side: Secondary | ICD-10-CM | POA: Diagnosis not present

## 2022-04-29 ENCOUNTER — Encounter: Payer: Managed Care, Other (non HMO) | Admitting: Family Medicine

## 2022-04-29 DIAGNOSIS — Z0000 Encounter for general adult medical examination without abnormal findings: Secondary | ICD-10-CM

## 2022-05-13 ENCOUNTER — Encounter: Payer: Self-pay | Admitting: Family Medicine

## 2022-05-13 ENCOUNTER — Ambulatory Visit (INDEPENDENT_AMBULATORY_CARE_PROVIDER_SITE_OTHER): Payer: Managed Care, Other (non HMO) | Admitting: Family Medicine

## 2022-05-13 VITALS — BP 132/81 | HR 83 | Temp 98.3°F | Ht 64.0 in | Wt 213.9 lb

## 2022-05-13 DIAGNOSIS — Z23 Encounter for immunization: Secondary | ICD-10-CM

## 2022-05-13 DIAGNOSIS — G4700 Insomnia, unspecified: Secondary | ICD-10-CM | POA: Diagnosis not present

## 2022-05-13 DIAGNOSIS — F411 Generalized anxiety disorder: Secondary | ICD-10-CM

## 2022-05-13 DIAGNOSIS — Z0000 Encounter for general adult medical examination without abnormal findings: Secondary | ICD-10-CM

## 2022-05-13 DIAGNOSIS — Z1231 Encounter for screening mammogram for malignant neoplasm of breast: Secondary | ICD-10-CM

## 2022-05-13 LAB — URINALYSIS, ROUTINE W REFLEX MICROSCOPIC
Bilirubin, UA: NEGATIVE
Glucose, UA: NEGATIVE
Ketones, UA: NEGATIVE
Leukocytes,UA: NEGATIVE
Nitrite, UA: NEGATIVE
Protein,UA: NEGATIVE
RBC, UA: NEGATIVE
Specific Gravity, UA: 1.01 (ref 1.005–1.030)
Urobilinogen, Ur: 0.2 mg/dL (ref 0.2–1.0)
pH, UA: 6.5 (ref 5.0–7.5)

## 2022-05-13 MED ORDER — BELSOMRA 5 MG PO TABS: 5.0000 mg | ORAL_TABLET | Freq: Every evening | ORAL | 2 refills | Status: DC | PRN

## 2022-05-13 MED ORDER — CLONAZEPAM 0.5 MG PO TABS: 0.2500 mg | ORAL_TABLET | Freq: Two times a day (BID) | ORAL | 2 refills | Status: DC | PRN

## 2022-05-13 MED ORDER — NAPROXEN 500 MG PO TABS: 500.0000 mg | ORAL_TABLET | Freq: Two times a day (BID) | ORAL | 3 refills | Status: DC

## 2022-05-13 MED ORDER — CYCLOBENZAPRINE HCL 10 MG PO TABS: ORAL_TABLET | ORAL | 6 refills | Status: DC

## 2022-05-13 NOTE — Assessment & Plan Note (Signed)
Under good control on current regimen. Continue current regimen. Continue to monitor. Call with any concerns. Refills given. Rx should last 6 months. Call with any concerns.

## 2022-05-13 NOTE — Assessment & Plan Note (Signed)
Trazodone leaves her too groggy. Will try belsomra. Recheck 3 months.  Call with any concerns.

## 2022-05-13 NOTE — Patient Instructions (Signed)
Please call to schedule your mammogram and/or bone density: [facility]  Address: [address], [place], [place] [address] Phone: [phone]  [facility] [place] [address] [place],  [place]  [address] Phone: [phone]   

## 2022-05-13 NOTE — Progress Notes (Signed)
BP 132/81   Pulse 83   Temp 98.3 F (36.8 C)   Ht 5\' 4"  (1.626 m)   Wt 213 lb 14.4 oz (97 kg)   SpO2 100%   BMI 36.72 kg/m    Subjective:    Patient ID: , female    DOB: [birth year], 51 y.o.   MRN: [id number]  HPI: Heidi Taylor is a 51 y.o. female presenting on 05/13/2022 for comprehensive medical examination. Current medical complaints include:  INSOMNIA Duration: chronic Satisfied with sleep quality: no Difficulty falling asleep: no Difficulty staying asleep: yes Waking a few hours after sleep onset: yes Early morning awakenings: yes Daytime hypersomnolence: no Wakes feeling refreshed: no Good sleep hygiene: yes Apnea: no Snoring: no Depressed/anxious mood: yes Recent stress: no Restless legs/nocturnal leg cramps: no Chronic pain/arthritis: no History of sleep study:  yes Treatments attempted:  trazodone, melatonin, uinsom, and benadryl   ANXIETY/STRESS Duration: chronic Status:stable Anxious mood: yes  Excessive worrying: yes Irritability: no  Sweating: no Nausea: no Palpitations:no Hyperventilation: no Panic attacks: no Agoraphobia: no  Obscessions/compulsions: no Depressed mood: no    05/13/2022    9:50 AM 11/07/2021    9:54 AM 04/27/2021   10:15 AM 04/19/2020   12:48 PM 11/08/2019    3:49 PM  Depression screen PHQ 2/9  Decreased Interest 1 1 0 1 1  Down, Depressed, Hopeless 1 1 1 1 1   PHQ - 2 Score 2 2 1 2 2   Altered sleeping 1 3 1 2 3   Tired, decreased energy 1 3 1 2 1   Change in appetite 1 1 1  0 0  Feeling bad or failure about yourself  0 0 0 0 0  Trouble concentrating 0 0 0 0 0  Moving slowly or fidgety/restless 0 0 0 0 0  Suicidal thoughts 0 0 0 0 0  PHQ-9 Score 5 9 4 6 6   Difficult doing work/chores Somewhat difficult Somewhat difficult Not difficult at all Somewhat difficult Somewhat difficult   Anhedonia: no Weight changes: no Insomnia: yes hard to stay asleep  Hypersomnia: yes Fatigue/loss of energy: yes Feelings of  worthlessness: no Feelings of guilt: no Impaired concentration/indecisiveness: no Suicidal ideations: no  Crying spells: no Recent Stressors/Life Changes: no   Relationship problems: no   Family stress: no     Financial stress: no    Job stress: no    Recent death/loss: no  She currently lives with: husband Menopausal Symptoms: no  Depression Screen done today and results listed below:     05/13/2022    9:50 AM 11/07/2021    9:54 AM 04/27/2021   10:15 AM 04/19/2020   12:48 PM 11/08/2019    3:49 PM  Depression screen PHQ 2/9  Decreased Interest 1 1 0 1 1  Down, Depressed, Hopeless 1 1 1 1 1   PHQ - 2 Score 2 2 1 2 2   Altered sleeping 1 3 1 2 3   Tired, decreased energy 1 3 1 2 1   Change in appetite 1 1 1  0 0  Feeling bad or failure about yourself  0 0 0 0 0  Trouble concentrating 0 0 0 0 0  Moving slowly or fidgety/restless 0 0 0 0 0  Suicidal thoughts 0 0 0 0 0  PHQ-9 Score 5 9 4 6 6   Difficult doing work/chores Somewhat difficult Somewhat difficult Not difficult at all Somewhat difficult Somewhat difficult    Past Medical History:  Past Medical History:  Diagnosis Date   Anxiety  DDD (degenerative disc disease), lumbar     Surgical History:  Past Surgical History:  Procedure Laterality Date   HERNIA REPAIR      Medications:  Current Outpatient Medications on File Prior to Visit  Medication Sig   cetirizine (ZYRTEC) 10 MG tablet Take 10 mg by mouth daily.   clindamycin (CLEOCIN T) 1 % lotion Apply topically daily as needed.   fluticasone (FLONASE) 50 MCG/ACT nasal spray Place into the nose.   gabapentin (NEURONTIN) 300 MG capsule Take 300 mg by mouth as needed.   No current facility-administered medications on file prior to visit.    Allergies:  Allergies  Allergen Reactions   Duloxetine Other (See Comments)    GI and weight   Sertraline Other (See Comments)    GI and weight   Trintellix [Vortioxetine] Nausea And Vomiting    Social History:  Social  History   Socioeconomic History   Marital status: Married    Spouse name: Not on file   Number of children: Not on file   Years of education: Not on file   Highest education level: Not on file  Occupational History   Not on file  Tobacco Use   Smoking status: Former    Types: Cigarettes    Quit date: 09/06/2009    Years since quitting: 12.6   Smokeless tobacco: Never  Vaping Use   Vaping Use: Never used  Substance and Sexual Activity   Alcohol use: Not Currently   Drug use: No   Sexual activity: Yes    Birth control/protection: None  Other Topics Concern   Not on file  Social History Narrative   Not on file   Social Determinants of Health   Financial Resource Strain: Not on file  Food Insecurity: Not on file  Transportation Needs: Not on file  Physical Activity: Not on file  Stress: Not on file  Social Connections: Not on file  Intimate Partner Violence: Not on file   Social History   Tobacco Use  Smoking Status Former   Types: Cigarettes   Quit date: 09/06/2009   Years since quitting: 12.6  Smokeless Tobacco Never   Social History   Substance and Sexual Activity  Alcohol Use Not Currently    Family History:  Family History  Problem Relation Age of Onset   Cancer Mother        Brain   Parkinson's disease Father    Breast cancer Maternal Aunt    Parkinson's disease Paternal Grandfather     Past medical history, surgical history, medications, allergies, family history and social history reviewed with patient today and changes made to appropriate areas of the chart.   Review of Systems  Constitutional:  Positive for diaphoresis and malaise/fatigue. Negative for chills, fever and weight loss.  HENT: Negative.    Eyes: Negative.   Respiratory: Negative.    Cardiovascular: Negative.   Gastrointestinal:  Positive for heartburn. Negative for abdominal pain, blood in stool, constipation, diarrhea, melena, nausea and vomiting.  Genitourinary: Negative.    Musculoskeletal:  Positive for back pain. Negative for falls, joint pain, myalgias and neck pain.  Skin: Negative.   Neurological: Negative.   Endo/Heme/Allergies:  Positive for environmental allergies. Negative for polydipsia. Does not bruise/bleed easily.  Psychiatric/Behavioral:  Negative for depression, hallucinations, memory loss, substance abuse and suicidal ideas. The patient has insomnia. The patient is not nervous/anxious.    All other ROS negative except what is listed above and in the HPI.  Objective:    BP 132/81   Pulse 83   Temp 98.3 F (36.8 C)   Ht 5\' 4"  (1.626 m)   Wt 213 lb 14.4 oz (97 kg)   SpO2 100%   BMI 36.72 kg/m   Wt Readings from Last 3 Encounters:  05/13/22 213 lb 14.4 oz (97 kg)  11/07/21 199 lb 9.6 oz (90.5 kg)  04/27/21 207 lb 6.4 oz (94.1 kg)    Physical Exam Vitals and nursing note reviewed.  Constitutional:      General: She is not in acute distress.    Appearance: Normal appearance. She is obese. She is not ill-appearing, toxic-appearing or diaphoretic.  HENT:     Head: Normocephalic and atraumatic.     Right Ear: Tympanic membrane, ear canal and external ear normal. There is no impacted cerumen.     Left Ear: Tympanic membrane, ear canal and external ear normal. There is no impacted cerumen.     Nose: Nose normal. No congestion or rhinorrhea.     Mouth/Throat:     Mouth: Mucous membranes are moist.     Pharynx: Oropharynx is clear. No oropharyngeal exudate or posterior oropharyngeal erythema.  Eyes:     General: No scleral icterus.       Right eye: No discharge.        Left eye: No discharge.     Extraocular Movements: Extraocular movements intact.     Conjunctiva/sclera: Conjunctivae normal.     Pupils: Pupils are equal, round, and reactive to light.  Neck:     Vascular: No carotid bruit.  Cardiovascular:     Rate and Rhythm: Normal rate and regular rhythm.     Pulses: Normal pulses.     Heart sounds: No murmur heard.    No  friction rub. No gallop.  Pulmonary:     Effort: Pulmonary effort is normal. No respiratory distress.     Breath sounds: Normal breath sounds. No stridor. No wheezing, rhonchi or rales.  Chest:     Chest wall: No tenderness.  Abdominal:     General: Abdomen is flat. Bowel sounds are normal. There is no distension.     Palpations: Abdomen is soft. There is no mass.     Tenderness: There is no abdominal tenderness. There is no right CVA tenderness, left CVA tenderness, guarding or rebound.     Hernia: No hernia is present.  Genitourinary:    Comments: Breast and pelvic exams deferred with shared decision making Musculoskeletal:        General: No swelling, tenderness, deformity or signs of injury.     Cervical back: Normal range of motion and neck supple. No rigidity. No muscular tenderness.     Right lower leg: No edema.     Left lower leg: No edema.  Lymphadenopathy:     Cervical: No cervical adenopathy.  Skin:    General: Skin is warm and dry.     Capillary Refill: Capillary refill takes less than 2 seconds.     Coloration: Skin is not jaundiced or pale.     Findings: No bruising, erythema, lesion or rash.  Neurological:     General: No focal deficit present.     Mental Status: She is alert and oriented to person, place, and time. Mental status is at baseline.     Cranial Nerves: No cranial nerve deficit.     Sensory: No sensory deficit.     Motor: No weakness.     Coordination: Coordination normal.  Gait: Gait normal.     Deep Tendon Reflexes: Reflexes normal.  Psychiatric:        Mood and Affect: Mood normal.        Behavior: Behavior normal.        Thought Content: Thought content normal.        Judgment: Judgment normal.     Results for orders placed or performed in visit on 11/07/21  [facility]  Result Value Ref Range   LH 50.8 mIU/mL  FSH  Result Value Ref Range   FSH 67.8 mIU/mL  Estradiol  Result Value Ref Range   Estradiol 34.8 pg/mL  TSH  Result Value Ref  Range   TSH 2.120 0.450 - 4.500 uIU/mL      Assessment & Plan:   Problem List Items Addressed This Visit       Other   Anxiety disorder    Under good control on current regimen. Continue current regimen. Continue to monitor. Call with any concerns. Refills given. Rx should last 6 months. Call with any concerns.         Insomnia    Trazodone leaves her too groggy. Will try belsomra. Recheck 3 months.  Call with any concerns.       Other Visit Diagnoses     Routine general medical examination at a health care facility    -  Primary   Vaccines up to date. Screening labs checked today. Pap postponed due to back pain. Mammogram ordered. Cologuard up to date. Continue diet and exerciese.    Relevant Orders   CBC with Differential/Platelet   Comprehensive metabolic panel   Lipid Panel w/o Chol/HDL Ratio   Urinalysis, Routine w reflex microscopic   TSH   Hgb A1c w/o eAG   Need for shingles vaccine       Shignrix #1 given.   Relevant Orders   Zoster Recombinant (Shingrix ) (Completed)   Encounter for screening mammogram for malignant neoplasm of breast       Mammogram ordered today.   Relevant Orders   MM 3D SCREEN BREAST BILATERAL        Follow up plan: Return in about 3 months (around 08/13/2022) for pap.   LABORATORY TESTING:  - Pap smear:  will do in 3 months due to back pain  IMMUNIZATIONS:   - Tdap: Tetanus vaccination status reviewed: last tetanus booster within 10 years. - Influenza: Postponed to flu season - Pneumovax: Not applicable - Prevnar: Not applicable - COVID: Up to date - HPV: Not applicable - Shingrix vaccine: Administered today  SCREENING: -Mammogram: Ordered today  - Colonoscopy: Up to date   PATIENT COUNSELING:   Advised to take 1 mg of folate supplement per day if capable of pregnancy.   Sexuality: Discussed sexually transmitted diseases, partner selection, use of condoms, avoidance of unintended pregnancy  and contraceptive  alternatives.   Advised to avoid cigarette smoking.  I discussed with the patient that most people either abstain from alcohol or drink within safe limits (<=14/week and <=4 drinks/occasion for males, <=7/weeks and <= 3 drinks/occasion for females) and that the risk for alcohol disorders and other health effects rises proportionally with the number of drinks per week and how often a drinker exceeds daily limits.  Discussed cessation/primary prevention of drug use and availability of treatment for abuse.   Diet: Encouraged to adjust caloric intake to maintain  or achieve ideal body weight, to reduce intake of dietary saturated fat and total fat, to limit sodium intake  by avoiding high sodium foods and not adding table salt, and to maintain adequate dietary potassium and calcium preferably from fresh fruits, vegetables, and low-fat dairy products.    stressed the importance of regular exercise  Injury prevention: Discussed safety belts, safety helmets, smoke detector, smoking near bedding or upholstery.   Dental health: Discussed importance of regular tooth brushing, flossing, and dental visits.    NEXT PREVENTATIVE PHYSICAL DUE IN 1 YEAR. Return in about 3 months (around 08/13/2022) for pap.

## 2022-05-14 ENCOUNTER — Telehealth: Payer: Self-pay

## 2022-05-14 LAB — COMPREHENSIVE METABOLIC PANEL
ALT: 27 IU/L (ref 0–32)
AST: 21 IU/L (ref 0–40)
Albumin/Globulin Ratio: 1.5 (ref 1.2–2.2)
Albumin: 4.5 g/dL (ref 3.9–4.9)
Alkaline Phosphatase: 91 IU/L (ref 44–121)
BUN/Creatinine Ratio: 13 (ref 9–23)
BUN: 12 mg/dL (ref 6–24)
Bilirubin Total: 0.5 mg/dL (ref 0.0–1.2)
CO2: 22 mmol/L (ref 20–29)
Calcium: 9.8 mg/dL (ref 8.7–10.2)
Chloride: 99 mmol/L (ref 96–106)
Creatinine, Ser: 0.92 mg/dL (ref 0.57–1.00)
Globulin, Total: 3.1 g/dL (ref 1.5–4.5)
Glucose: 96 mg/dL (ref 70–99)
Potassium: 4.4 mmol/L (ref 3.5–5.2)
Sodium: 140 mmol/L (ref 134–144)
Total Protein: 7.6 g/dL (ref 6.0–8.5)
eGFR: 76 mL/min/{1.73_m2} (ref >59)

## 2022-05-14 LAB — CBC WITH DIFFERENTIAL/PLATELET
Basophils Absolute: 0.1 10*3/uL (ref 0.0–0.2)
Basos: 1 %
EOS (ABSOLUTE): 0.2 10*3/uL (ref 0.0–0.4)
Eos: 3 %
Hematocrit: 46.5 % (ref 34.0–46.6)
Hemoglobin: 15 g/dL (ref 11.1–15.9)
Immature Grans (Abs): 0 10*3/uL (ref 0.0–0.1)
Immature Granulocytes: 0 %
Lymphocytes Absolute: 2.2 10*3/uL (ref 0.7–3.1)
Lymphs: 26 %
MCH: 27.8 pg (ref 26.6–33.0)
MCHC: 32.3 g/dL (ref 31.5–35.7)
MCV: 86 fL (ref 79–97)
Monocytes Absolute: 0.6 10*3/uL (ref 0.1–0.9)
Monocytes: 7 %
Neutrophils Absolute: 5.1 10*3/uL (ref 1.4–7.0)
Neutrophils: 63 %
Platelets: 479 10*3/uL — ABNORMAL HIGH (ref 150–450)
RBC: 5.39 x10E6/uL — ABNORMAL HIGH (ref 3.77–5.28)
RDW: 13.6 % (ref 11.7–15.4)
WBC: 8.3 10*3/uL (ref 3.4–10.8)

## 2022-05-14 LAB — LIPID PANEL W/O CHOL/HDL RATIO
Cholesterol, Total: 282 mg/dL — ABNORMAL HIGH (ref 100–199)
HDL: 46 mg/dL (ref >39)
LDL Chol Calc (NIH): 201 mg/dL — ABNORMAL HIGH (ref 0–99)
Triglycerides: 185 mg/dL — ABNORMAL HIGH (ref 0–149)
VLDL Cholesterol Cal: 35 mg/dL (ref 5–40)

## 2022-05-14 LAB — HGB A1C W/O EAG: Hgb A1c MFr Bld: 5.8 % — ABNORMAL HIGH (ref 4.8–5.6)

## 2022-05-14 LAB — TSH: TSH: 2.64 u[IU]/mL (ref 0.450–4.500)

## 2022-05-14 NOTE — Telephone Encounter (Signed)
Form completed and faxed, will notify patient.

## 2022-05-14 NOTE — Telephone Encounter (Signed)
-----   Message from Rolley Sims, [place] sent at 05/13/2022 10:19 AM EDT ----- [employer] form

## 2022-05-15 ENCOUNTER — Encounter: Payer: Self-pay | Admitting: Family Medicine

## 2022-05-15 DIAGNOSIS — E785 Hyperlipidemia, unspecified: Secondary | ICD-10-CM | POA: Insufficient documentation

## 2022-05-24 ENCOUNTER — Telehealth: Payer: Self-pay

## 2022-05-24 MED ORDER — ESZOPICLONE 1 MG PO TABS: 1.0000 mg | ORAL_TABLET | Freq: Every evening | ORAL | 2 refills | Status: DC | PRN

## 2022-05-24 NOTE — Telephone Encounter (Signed)
PA was denied for Belsomra because patient has not tried and failed one of: DOXEPIN, EZOPICLONE, TEMAZEPAM, ZALEPLON, ZOLPIDEM, OR ZOLPIDEM ER

## 2022-05-24 NOTE — Telephone Encounter (Signed)
PA initiated via CoverMyMeds for Belsomra 5MG   Key:  Waiting on determination

## 2022-08-13 ENCOUNTER — Ambulatory Visit (INDEPENDENT_AMBULATORY_CARE_PROVIDER_SITE_OTHER): Payer: Managed Care, Other (non HMO) | Admitting: Family Medicine

## 2022-08-13 ENCOUNTER — Encounter: Payer: Self-pay | Admitting: Family Medicine

## 2022-08-13 ENCOUNTER — Other Ambulatory Visit (HOSPITAL_COMMUNITY)
Admission: RE | Admit: 2022-08-13 | Discharge: 2022-08-13 | Disposition: A | Discharge status: Home / Self Care | Payer: Managed Care, Other (non HMO) | Source: Ambulatory Visit | Attending: Family Medicine | Admitting: Family Medicine

## 2022-08-13 VITALS — BP 129/86 | HR 80 | Temp 97.9°F | Wt 216.2 lb

## 2022-08-13 DIAGNOSIS — E782 Mixed hyperlipidemia: Secondary | ICD-10-CM

## 2022-08-13 DIAGNOSIS — Z124 Encounter for screening for malignant neoplasm of cervix: Secondary | ICD-10-CM | POA: Insufficient documentation

## 2022-08-13 DIAGNOSIS — Z23 Encounter for immunization: Secondary | ICD-10-CM

## 2022-08-13 NOTE — Assessment & Plan Note (Signed)
Rechecking levels today. Await results. Treat as needed.  

## 2022-08-13 NOTE — Progress Notes (Signed)
BP 129/86   Pulse 80   Temp 97.9 F (36.6 C) (Oral)   Wt 216 lb 3.2 oz (98.1 kg)   SpO2 99%   BMI 37.11 kg/m    Subjective:    Patient ID: Heidi Taylor, female    DOB: [birth year], 51 y.o.   MRN: [id number]  HPI: Heidi Taylor is a 51 y.o. female  Chief Complaint  Patient presents with   Gynecologic Exam        Hyperlipidemia   Here today for a pap. Feeling well. No concerns.   HYPERLIPIDEMIA Hyperlipidemia status:  newly diagnosed Satisfied with current treatment?  yes Side effects:  N/A Past cholesterol meds: none Supplements: none Aspirin:  no The 10-year ASCVD risk score (Arnett DK, et al., 2019) is: 2.5%   Values used to calculate the score:     Age: 68 years     Sex: Female     Is Non-Hispanic African American: No     Diabetic: No     Tobacco smoker: No     Systolic Blood Pressure: 939 mmHg     Is BP treated: No     HDL Cholesterol: 46 mg/dL     Total Cholesterol: 282 mg/dL Chest pain:  no Coronary artery disease:  no  Relevant past medical, surgical, family and social history reviewed and updated as indicated. Interim medical history since our last visit reviewed. Allergies and medications reviewed and updated.  Review of Systems  Respiratory: Negative.    Cardiovascular: Negative.   Gastrointestinal: Negative.   Musculoskeletal: Negative.   Psychiatric/Behavioral: Negative.      Per HPI unless specifically indicated above     Objective:    BP 129/86   Pulse 80   Temp 97.9 F (36.6 C) (Oral)   Wt 216 lb 3.2 oz (98.1 kg)   SpO2 99%   BMI 37.11 kg/m   Wt Readings from Last 3 Encounters:  08/13/22 216 lb 3.2 oz (98.1 kg)  05/13/22 213 lb 14.4 oz (97 kg)  11/07/21 199 lb 9.6 oz (90.5 kg)    Physical Exam Vitals and nursing note reviewed. Exam conducted with a chaperone present.  Constitutional:      General: She is not in acute distress.    Appearance: Normal appearance. She is well-developed.  HENT:     Head: Normocephalic and  atraumatic.     Right Ear: Hearing and external ear normal.     Left Ear: Hearing and external ear normal.     Nose: Nose normal.     Mouth/Throat:     Mouth: Mucous membranes are moist.     Pharynx: Oropharynx is clear.  Eyes:     General: Lids are normal. No scleral icterus.       Right eye: No discharge.        Left eye: No discharge.     Conjunctiva/sclera: Conjunctivae normal.  Pulmonary:     Effort: Pulmonary effort is normal. No respiratory distress.  Genitourinary:    Labia:        Right: No rash, tenderness, lesion or injury.        Left: No rash, tenderness, lesion or injury.      Vagina: Normal.     Cervix: Normal.     Uterus: Normal.      Adnexa: Right adnexa normal and left adnexa normal.  Musculoskeletal:        General: Normal range of motion.  Skin:    Coloration: Skin is not jaundiced  or pale.     Findings: No bruising, erythema, lesion or rash.  Neurological:     Mental Status: She is alert. Mental status is at baseline. She is disoriented.  Psychiatric:        Mood and Affect: Mood normal.        Speech: Speech normal.        Behavior: Behavior normal.        Thought Content: Thought content normal.        Judgment: Judgment normal.     Results for orders placed or performed in visit on 05/13/22  CBC with Differential/Platelet  Result Value Ref Range   WBC 8.3 3.4 - 10.8 x10E3/uL   RBC 5.39 (H) 3.77 - 5.28 x10E6/uL   Hemoglobin 15.0 11.1 - 15.9 g/dL   Hematocrit 46.5 34.0 - 46.6 %   MCV 86 79 - 97 fL   MCH 27.8 26.6 - 33.0 pg   MCHC 32.3 31.5 - 35.7 g/dL   RDW 13.6 11.7 - 15.4 %   Platelets 479 (H) 150 - 450 x10E3/uL   Neutrophils 63 Not Estab. %   Lymphs 26 Not Estab. %   Monocytes 7 Not Estab. %   Eos 3 Not Estab. %   Basos 1 Not Estab. %   Neutrophils Absolute 5.1 1.4 - 7.0 x10E3/uL   Lymphocytes Absolute 2.2 0.7 - 3.1 x10E3/uL   Monocytes Absolute 0.6 0.1 - 0.9 x10E3/uL   EOS (ABSOLUTE) 0.2 0.0 - 0.4 x10E3/uL   Basophils Absolute 0.1  0.0 - 0.2 x10E3/uL   Immature Granulocytes 0 Not Estab. %   Immature Grans (Abs) 0.0 0.0 - 0.1 x10E3/uL  Comprehensive metabolic panel  Result Value Ref Range   Glucose 96 70 - 99 mg/dL   BUN 12 6 - 24 mg/dL   Creatinine, Ser 0.92 0.57 - 1.00 mg/dL   eGFR 76 >59 mL/min/1.73   BUN/Creatinine Ratio 13 9 - 23   Sodium 140 134 - 144 mmol/L   Potassium 4.4 3.5 - 5.2 mmol/L   Chloride 99 96 - 106 mmol/L   CO2 22 20 - 29 mmol/L   Calcium 9.8 8.7 - 10.2 mg/dL   Total Protein 7.6 6.0 - 8.5 g/dL   Albumin 4.5 3.9 - 4.9 g/dL   Globulin, Total 3.1 1.5 - 4.5 g/dL   Albumin/Globulin Ratio 1.5 1.2 - 2.2   Bilirubin Total 0.5 0.0 - 1.2 mg/dL   Alkaline Phosphatase 91 44 - 121 IU/L   AST 21 0 - 40 IU/L   ALT 27 0 - 32 IU/L  Lipid Panel w/o Chol/HDL Ratio  Result Value Ref Range   Cholesterol, Total 282 (H) 100 - 199 mg/dL   Triglycerides 185 (H) 0 - 149 mg/dL   HDL 46 >39 mg/dL   VLDL Cholesterol Cal 35 5 - 40 mg/dL   LDL Chol Calc (NIH) 201 (H) 0 - 99 mg/dL   Lipid Comment: Comment   Urinalysis, Routine w reflex microscopic  Result Value Ref Range   Specific Gravity, UA 1.010 1.005 - 1.030   pH, UA 6.5 5.0 - 7.5   Color, UA Yellow Yellow   Appearance Ur Clear Clear   Leukocytes,UA Negative Negative   Protein,UA Negative Negative/Trace   Glucose, UA Negative Negative   Ketones, UA Negative Negative   RBC, UA Negative Negative   Bilirubin, UA Negative Negative   Urobilinogen, Ur 0.2 0.2 - 1.0 mg/dL   Nitrite, UA Negative Negative  TSH  Result Value Ref Range  TSH 2.640 0.450 - 4.500 uIU/mL  Hgb A1c w/o eAG  Result Value Ref Range   Hgb A1c MFr Bld 5.8 (H) 4.8 - 5.6 %      Assessment & Plan:   Problem List Items Addressed This Visit       Other   HLD (hyperlipidemia) - Primary    Rechecking levels today. Await results. Treat as needed.       Relevant Orders   Comprehensive metabolic panel   Lipid Panel w/o Chol/HDL Ratio   Other Visit Diagnoses     Screening for  cervical cancer       Pap done. Await results.   Relevant Orders   Cytology - PAP   Need for shingles vaccine       Shingrix #2 given today.   Relevant Orders   Zoster Recombinant (Shingrix ) (Completed)        Follow up plan: Return in about 3 months (around 11/13/2022) for anxiety follow up.

## 2022-08-14 LAB — COMPREHENSIVE METABOLIC PANEL
ALT: 20 IU/L (ref 0–32)
AST: 20 IU/L (ref 0–40)
Albumin/Globulin Ratio: 1.6 (ref 1.2–2.2)
Albumin: 4.7 g/dL (ref 3.9–4.9)
Alkaline Phosphatase: 83 IU/L (ref 44–121)
BUN/Creatinine Ratio: 14 (ref 9–23)
BUN: 14 mg/dL (ref 6–24)
Bilirubin Total: 0.4 mg/dL (ref 0.0–1.2)
CO2: 24 mmol/L (ref 20–29)
Calcium: 9.8 mg/dL (ref 8.7–10.2)
Chloride: 100 mmol/L (ref 96–106)
Creatinine, Ser: 1.02 mg/dL — ABNORMAL HIGH (ref 0.57–1.00)
Globulin, Total: 2.9 g/dL (ref 1.5–4.5)
Glucose: 86 mg/dL (ref 70–99)
Potassium: 4.4 mmol/L (ref 3.5–5.2)
Sodium: 139 mmol/L (ref 134–144)
Total Protein: 7.6 g/dL (ref 6.0–8.5)
eGFR: 67 mL/min/{1.73_m2} (ref >59)

## 2022-08-14 LAB — LIPID PANEL W/O CHOL/HDL RATIO
Cholesterol, Total: 244 mg/dL — ABNORMAL HIGH (ref 100–199)
HDL: 57 mg/dL (ref >39)
LDL Chol Calc (NIH): 170 mg/dL — ABNORMAL HIGH (ref 0–99)
Triglycerides: 97 mg/dL (ref 0–149)
VLDL Cholesterol Cal: 17 mg/dL (ref 5–40)

## 2022-08-16 LAB — CYTOLOGY - PAP
Adequacy: ABSENT
Comment: NEGATIVE
Diagnosis: NEGATIVE
Diagnosis: REACTIVE
High risk HPV: NEGATIVE

## 2022-08-21 ENCOUNTER — Encounter: Payer: Self-pay | Admitting: Family Medicine

## 2022-09-13 MED ORDER — ESZOPICLONE 2 MG PO TABS: 2.0000 mg | ORAL_TABLET | Freq: Every evening | ORAL | 1 refills | Status: DC | PRN

## 2022-09-26 ENCOUNTER — Ambulatory Visit: Payer: Managed Care, Other (non HMO)

## 2022-11-13 ENCOUNTER — Ambulatory Visit: Payer: Managed Care, Other (non HMO) | Admitting: Family Medicine

## 2022-12-03 ENCOUNTER — Ambulatory Visit: Payer: Managed Care, Other (non HMO)

## 2022-12-10 ENCOUNTER — Ambulatory Visit: Payer: Managed Care, Other (non HMO) | Admitting: Family Medicine

## 2022-12-10 ENCOUNTER — Encounter: Payer: Self-pay | Admitting: Family Medicine

## 2022-12-10 VITALS — BP 133/82 | HR 84 | Temp 98.6°F | Ht 64.0 in | Wt 221.8 lb

## 2022-12-10 DIAGNOSIS — G4700 Insomnia, unspecified: Secondary | ICD-10-CM | POA: Diagnosis not present

## 2022-12-10 DIAGNOSIS — F411 Generalized anxiety disorder: Secondary | ICD-10-CM

## 2022-12-10 DIAGNOSIS — Z6832 Body mass index (BMI) 32.0-32.9, adult: Secondary | ICD-10-CM | POA: Insufficient documentation

## 2022-12-10 MED ORDER — ESZOPICLONE 2 MG PO TABS: 2.0000 mg | ORAL_TABLET | Freq: Every evening | ORAL | 5 refills | Status: DC | PRN

## 2022-12-10 MED ORDER — CLONAZEPAM 0.5 MG PO TABS: 0.2500 mg | ORAL_TABLET | Freq: Two times a day (BID) | ORAL | 2 refills | Status: DC | PRN

## 2022-12-10 MED ORDER — CYCLOBENZAPRINE HCL 10 MG PO TABS: ORAL_TABLET | ORAL | 6 refills | Status: DC

## 2022-12-10 NOTE — Assessment & Plan Note (Signed)
Under good control on current regimen. Continue current regimen. Continue to monitor. Call with any concerns. Refills given. Follow up 6 months  

## 2022-12-10 NOTE — Assessment & Plan Note (Signed)
Due to low back pain and HLD. Consider weight loss medicine- will check on coverage. Call with any concerns.

## 2022-12-10 NOTE — Progress Notes (Signed)
BP 133/82   Pulse 84   Temp 98.6 F (37 C) (Oral)   Ht 5\' 4"  (1.626 m)   Wt 221 lb 12.8 oz (100.6 kg)   SpO2 98%   BMI 38.07 kg/m    Subjective:    Patient ID: Heidi Taylor, female    DOB: Jul 14, 1971, 52 y.o.   MRN: [id number]  HPI: Heidi Taylor is a 52 y.o. female  Chief Complaint  Patient presents with   Anxiety   ANXIETY/STRESS Duration: chronic Status:stable Anxious mood: yes  Excessive worrying: no Irritability: no  Sweating: no Nausea: no Palpitations:no Hyperventilation: no Panic attacks: no Agoraphobia: no  Obscessions/compulsions: no Depressed mood: no    12/10/2022   10:34 AM 08/13/2022    9:10 AM 05/13/2022    9:50 AM 11/07/2021    9:54 AM 04/27/2021   10:15 AM  Depression screen PHQ 2/9  Decreased Interest 1 1 1 1  0  Down, Depressed, Hopeless 1 1 1 1 1   PHQ - 2 Score 2 2 2 2 1   Altered sleeping 2 2 1 3 1   Tired, decreased energy 1 1 1 3 1   Change in appetite 0 1 1 1 1   Feeling bad or failure about yourself  0 0 0 0 0  Trouble concentrating 0 0 0 0 0  Moving slowly or fidgety/restless 0 0 0 0 0  Suicidal thoughts 0 0 0 0 0  PHQ-9 Score 5 6 5 9 4   Difficult doing work/chores Somewhat difficult Somewhat difficult Somewhat difficult Somewhat difficult Not difficult at all   Anhedonia: no Weight changes: no Insomnia: no   Hypersomnia: no Fatigue/loss of energy: no Feelings of worthlessness: no Feelings of guilt: no Impaired concentration/indecisiveness: no Suicidal ideations: no  Crying spells: no Recent Stressors/Life Changes: no   Relationship problems: no   Family stress: no     Financial stress: no    Job stress: no    Recent death/loss: no  INSOMNIA Duration: chronic Satisfied with sleep quality: yes Difficulty falling asleep: no Difficulty staying asleep: no Waking a few hours after sleep onset: no Early morning awakenings: no Daytime hypersomnolence: no Wakes feeling refreshed: no Good sleep hygiene: no Apnea:  no Snoring: no Depressed/anxious mood: no Recent stress: no Restless legs/nocturnal leg cramps: no Chronic pain/arthritis: no History of sleep study: yes Treatments attempted:  lunesta, melatonin, uinsom, benadryl, and ambien   OBESITY Previous attempts at weight loss: yes Complications of obesity: HLD, Low back pain Peak weight: current (221) Weight loss goal: to be healthy Weight loss to date: none Requesting obesity pharmacotherapy: yes Current weight loss supplements/medications: no Previous weight loss supplements/meds: no  Relevant past medical, surgical, family and social history reviewed and updated as indicated. Interim medical history since our last visit reviewed. Allergies and medications reviewed and updated.  Review of Systems  Constitutional: Negative.   Respiratory: Negative.    Cardiovascular: Negative.   Musculoskeletal: Negative.   Neurological: Negative.   Psychiatric/Behavioral: Negative.      Per HPI unless specifically indicated above     Objective:    BP 133/82   Pulse 84   Temp 98.6 F (37 C) (Oral)   Ht 5\' 4"  (1.626 m)   Wt 221 lb 12.8 oz (100.6 kg)   SpO2 98%   BMI 38.07 kg/m   Wt Readings from Last 3 Encounters:  12/10/22 221 lb 12.8 oz (100.6 kg)  08/13/22 216 lb 3.2 oz (98.1 kg)  05/13/22 213 lb 14.4 oz (97 kg)  Physical Exam Vitals and nursing note reviewed.  Constitutional:      General: She is not in acute distress.    Appearance: Normal appearance. She is obese. She is not ill-appearing, toxic-appearing or diaphoretic.  HENT:     Head: Normocephalic and atraumatic.     Right Ear: External ear normal.     Left Ear: External ear normal.     Nose: Nose normal.     Mouth/Throat:     Mouth: Mucous membranes are moist.     Pharynx: Oropharynx is clear.  Eyes:     General: No scleral icterus.       Right eye: No discharge.        Left eye: No discharge.     Extraocular Movements: Extraocular movements intact.      Conjunctiva/sclera: Conjunctivae normal.     Pupils: Pupils are equal, round, and reactive to light.  Cardiovascular:     Rate and Rhythm: Normal rate and regular rhythm.     Pulses: Normal pulses.     Heart sounds: Normal heart sounds. No murmur heard.    No friction rub. No gallop.  Pulmonary:     Effort: Pulmonary effort is normal. No respiratory distress.     Breath sounds: Normal breath sounds. No stridor. No wheezing, rhonchi or rales.  Chest:     Chest wall: No tenderness.  Musculoskeletal:        General: Normal range of motion.     Cervical back: Normal range of motion and neck supple.  Skin:    General: Skin is warm and dry.     Capillary Refill: Capillary refill takes less than 2 seconds.     Coloration: Skin is not jaundiced or pale.     Findings: No bruising, erythema, lesion or rash.  Neurological:     General: No focal deficit present.     Mental Status: She is alert and oriented to person, place, and time. Mental status is at baseline.  Psychiatric:        Mood and Affect: Mood normal.        Behavior: Behavior normal.        Thought Content: Thought content normal.        Judgment: Judgment normal.     Results for orders placed or performed in visit on 08/13/22  Comprehensive metabolic panel  Result Value Ref Range   Glucose 86 70 - 99 mg/dL   BUN 14 6 - 24 mg/dL   Creatinine, Ser 1.02 (H) 0.57 - 1.00 mg/dL   eGFR 67 >59 mL/min/1.73   BUN/Creatinine Ratio 14 9 - 23   Sodium 139 134 - 144 mmol/L   Potassium 4.4 3.5 - 5.2 mmol/L   Chloride 100 96 - 106 mmol/L   CO2 24 20 - 29 mmol/L   Calcium 9.8 8.7 - 10.2 mg/dL   Total Protein 7.6 6.0 - 8.5 g/dL   Albumin 4.7 3.9 - 4.9 g/dL   Globulin, Total 2.9 1.5 - 4.5 g/dL   Albumin/Globulin Ratio 1.6 1.2 - 2.2   Bilirubin Total 0.4 0.0 - 1.2 mg/dL   Alkaline Phosphatase 83 44 - 121 IU/L   AST 20 0 - 40 IU/L   ALT 20 0 - 32 IU/L  Lipid Panel w/o Chol/HDL Ratio  Result Value Ref Range   Cholesterol, Total 244  (H) 100 - 199 mg/dL   Triglycerides 97 0 - 149 mg/dL   HDL 57 >39 mg/dL   VLDL Cholesterol Cal 17  5 - 40 mg/dL   LDL Chol Calc (NIH) 170 (H) 0 - 99 mg/dL  Cytology - PAP  Result Value Ref Range   High risk HPV Negative    Adequacy      Satisfactory for evaluation; transformation zone component ABSENT.   Diagnosis      - Negative for Intraepithelial Lesions or Malignancy (NILM)   Diagnosis - Benign reactive/reparative changes    Comment Cellular changes consistent with hyperkeratosis.    Comment Normal Reference Range HPV - Negative       Assessment & Plan:   Problem List Items Addressed This Visit       Other   Anxiety disorder - Primary    Under good control on current regimen. Continue current regimen. Continue to monitor. Call with any concerns. Refills given. Follow up 6 months.        Insomnia    Under good control on current regimen. Continue current regimen. Continue to monitor. Call with any concerns. Refills given. Follow up 6 months.       Morbid obesity ([place])    Due to low back pain and HLD. Consider weight loss medicine- will check on coverage. Call with any concerns.         Follow up plan: Return in about 6 months (around 06/12/2023).

## 2022-12-10 NOTE — Patient Instructions (Signed)
Wegovy, zepbound

## 2022-12-12 ENCOUNTER — Other Ambulatory Visit: Payer: Self-pay | Admitting: Family Medicine

## 2022-12-12 MED ORDER — NALTREXONE-BUPROPION HCL ER 8-90 MG PO TB12: ORAL_TABLET | ORAL | 1 refills | Status: DC

## 2022-12-12 NOTE — Telephone Encounter (Signed)
Leave for Dr. Johnson review. °

## 2023-01-28 ENCOUNTER — Encounter: Payer: Self-pay | Admitting: Family Medicine

## 2023-01-29 MED ORDER — SEMAGLUTIDE-WEIGHT MANAGEMENT 0.5 MG/0.5ML ~~LOC~~ SOAJ: 0.5000 mg | SUBCUTANEOUS | 0 refills | Status: AC

## 2023-01-29 MED ORDER — SEMAGLUTIDE-WEIGHT MANAGEMENT 0.25 MG/0.5ML ~~LOC~~ SOAJ: 0.2500 mg | SUBCUTANEOUS | 0 refills | Status: AC

## 2023-01-29 MED ORDER — SEMAGLUTIDE-WEIGHT MANAGEMENT 1 MG/0.5ML ~~LOC~~ SOAJ: 1.0000 mg | SUBCUTANEOUS | 0 refills | Status: AC

## 2023-01-30 ENCOUNTER — Encounter: Payer: Self-pay | Admitting: Family Medicine

## 2023-01-30 ENCOUNTER — Telehealth: Payer: Self-pay | Admitting: Family Medicine

## 2023-01-30 NOTE — Telephone Encounter (Signed)
Can we please check on this

## 2023-01-30 NOTE — Telephone Encounter (Signed)
Spoke with prior authorization department to provide requested information to help process the prior authorization for the patient. Representative says that she will send the requested information over for review and await the determination for the patient's request.

## 2023-01-30 NOTE — Telephone Encounter (Signed)
Spoke with [employer] and initiated prior authorization over the phone via [employer]. Clinical Notes were faxed over to provided fax contact information at [phone]. Awaiting for determination via patient's insurance.   Reference ID: [id number]

## 2023-01-30 NOTE — Telephone Encounter (Signed)
Marchelle Folks with PA is calling asking if patient is using the medication Wegovy with Life style modifications.  CB#  [phone]

## 2023-02-06 ENCOUNTER — Encounter: Payer: Self-pay | Admitting: Family Medicine

## 2023-02-06 NOTE — Telephone Encounter (Signed)
Attempted to reach patient to get her scheduled for Nurse visit for injection of Wegovy.

## 2023-02-07 ENCOUNTER — Ambulatory Visit: Payer: Managed Care, Other (non HMO)

## 2023-02-07 NOTE — Progress Notes (Signed)
Patient presented to the office to be shown how to inject [facility]. Patient verbalized understanding of instructions and assisted with first injection here in the office.

## 2023-02-24 ENCOUNTER — Ambulatory Visit: Payer: Managed Care, Other (non HMO)

## 2023-03-03 ENCOUNTER — Ambulatory Visit: Payer: Managed Care, Other (non HMO)

## 2023-03-11 ENCOUNTER — Other Ambulatory Visit: Payer: Self-pay

## 2023-03-11 MED ORDER — CYCLOBENZAPRINE HCL 10 MG PO TABS: ORAL_TABLET | ORAL | 6 refills | Status: DC

## 2023-04-12 ENCOUNTER — Encounter: Payer: Self-pay | Admitting: Family Medicine

## 2023-04-16 ENCOUNTER — Other Ambulatory Visit: Payer: Self-pay | Admitting: Family Medicine

## 2023-04-16 MED ORDER — WEGOVY 0.5 MG/0.5ML ~~LOC~~ SOAJ: 0.5000 mg | SUBCUTANEOUS | 3 refills | Status: DC

## 2023-04-17 NOTE — Telephone Encounter (Signed)
Requested Prescriptions  Refused Prescriptions Disp Refills   WEGOVY 0.5 MG/0.5ML SOAJ [Pharmacy Med Name: WEGOVY 0.5MG /0.5ML INJ (4 PENS)] 2 mL 0    Sig: ADMINISTER 0.5 MG UNDER THE SKIN 1 TIME A WEEK     Endocrinology:  Diabetes - GLP-1 Receptor Agonists - semaglutide Failed - 04/16/2023  2:29 PM      Failed - HBA1C in normal range and within 180 days    HB A1C (BAYER DCA - WAIVED)  Date Value Ref Range Status  04/19/2020 5.2 <7.0 % Final    Comment:                                          Diabetic Adult            <7.0                                       Healthy Adult        4.3 - 5.7                                                           (DCCT/NGSP) American Diabetes Association's Summary of Glycemic Recommendations for Adults with Diabetes: Hemoglobin A1c <7.0%. More stringent glycemic goals (A1c <6.0%) may further reduce complications at the cost of increased risk of hypoglycemia.    Hgb A1c MFr Bld  Date Value Ref Range Status  05/13/2022 5.8 (H) 4.8 - 5.6 % Final    Comment:             Prediabetes: 5.7 - 6.4          Diabetes: >6.4          Glycemic control for adults with diabetes: <7.0          Failed - Cr in normal range and within 360 days    Creatinine, Ser  Date Value Ref Range Status  08/13/2022 1.02 (H) 0.57 - 1.00 mg/dL Final         Passed - Valid encounter within last 6 months    Recent Outpatient Visits           4 months ago Generalized anxiety disorder   [facility] [place], [name], DO   8 months ago Mixed hyperlipidemia   [facility] [place], [name], DO   11 months ago Routine general medical examination at a health care facility   [facility] [place], [place], DO   1 year ago Perimenopause   [facility] [place], [place] P, DO   1 year ago Routine general medical examination at a health care facility   [facility]  Dorcas Carrow, DO       Future Appointments             In 4 weeks Dorcas Carrow, DO [facility]   In 1 month [place], [name], DO  [facility]

## 2023-04-30 ENCOUNTER — Telehealth: Payer: Self-pay

## 2023-04-30 NOTE — Telephone Encounter (Signed)
Called and LVM asking for patient to please return my call.  

## 2023-04-30 NOTE — Telephone Encounter (Signed)
-----   Message from Olevia Perches sent at 04/28/2023  4:28 PM EDT ----- Please schedule for mammogram

## 2023-05-16 ENCOUNTER — Encounter: Payer: Self-pay | Admitting: Family Medicine

## 2023-05-16 ENCOUNTER — Ambulatory Visit (INDEPENDENT_AMBULATORY_CARE_PROVIDER_SITE_OTHER): Payer: Managed Care, Other (non HMO) | Admitting: Family Medicine

## 2023-05-16 VITALS — BP 128/72 | HR 81 | Ht 64.0 in | Wt 191.0 lb

## 2023-05-16 DIAGNOSIS — E782 Mixed hyperlipidemia: Secondary | ICD-10-CM | POA: Diagnosis not present

## 2023-05-16 DIAGNOSIS — Z1211 Encounter for screening for malignant neoplasm of colon: Secondary | ICD-10-CM

## 2023-05-16 DIAGNOSIS — Z0000 Encounter for general adult medical examination without abnormal findings: Secondary | ICD-10-CM

## 2023-05-16 DIAGNOSIS — F411 Generalized anxiety disorder: Secondary | ICD-10-CM

## 2023-05-16 DIAGNOSIS — Z6832 Body mass index (BMI) 32.0-32.9, adult: Secondary | ICD-10-CM

## 2023-05-16 DIAGNOSIS — Z1231 Encounter for screening mammogram for malignant neoplasm of breast: Secondary | ICD-10-CM

## 2023-05-16 LAB — BAYER DCA HB A1C WAIVED: HB A1C (BAYER DCA - WAIVED): 5.6 % (ref 4.8–5.6)

## 2023-05-16 LAB — URINALYSIS, ROUTINE W REFLEX MICROSCOPIC
Bilirubin, UA: NEGATIVE
Glucose, UA: NEGATIVE
Ketones, UA: NEGATIVE
Leukocytes,UA: NEGATIVE
Nitrite, UA: NEGATIVE
Protein,UA: NEGATIVE
Specific Gravity, UA: <1.005 — ABNORMAL LOW (ref 1.005–1.030)
Urobilinogen, Ur: 0.2 mg/dL (ref 0.2–1.0)
pH, UA: 5.5 (ref 5.0–7.5)

## 2023-05-16 LAB — MICROSCOPIC EXAMINATION
Bacteria, UA: NONE SEEN
WBC, UA: NONE SEEN /hpf (ref 0–5)

## 2023-05-16 MED ORDER — NAPROXEN 500 MG PO TABS: 500.0000 mg | ORAL_TABLET | Freq: Two times a day (BID) | ORAL | 3 refills | Status: DC

## 2023-05-16 MED ORDER — ONDANSETRON HCL 4 MG PO TABS: 4.0000 mg | ORAL_TABLET | Freq: Three times a day (TID) | ORAL | 3 refills | Status: DC | PRN

## 2023-05-16 MED ORDER — WEGOVY 0.5 MG/0.5ML ~~LOC~~ SOAJ: 0.5000 mg | SUBCUTANEOUS | 1 refills | Status: DC

## 2023-05-16 NOTE — Patient Instructions (Signed)
Please call to schedule your mammogram and/or bone density: [facility]  Address: [address], [place], [place] [address] Phone: [phone]  [facility] [place] [address] [place],  [place]  [address] Phone: [phone]   

## 2023-05-16 NOTE — Assessment & Plan Note (Signed)
Rechecking labs today. Await results. Treat as needed.  °

## 2023-05-16 NOTE — Progress Notes (Signed)
BP 128/72   Pulse 81   Ht 5\' 4"  (1.626 m)   Wt 191 lb (86.6 kg)   SpO2 99%   BMI 32.79 kg/m    Subjective:    Patient ID: Heidi Taylor, female    DOB: April 19, 1971, 52 y.o.   MRN: [id number]  HPI: Heidi Taylor is a 52 y.o. female presenting on 05/16/2023 for comprehensive medical examination. Current medical complaints include:  OBESITY Duration: chronic Previous attempts at weight loss: yes Complications of obesity: HLD, depression Peak weight: 221 Weight loss goal: to be healthy  Weight loss to date: 30lbs Requesting obesity pharmacotherapy: yes Current weight loss supplements/medications: yes Previous weight loss supplements/meds: no  ANXIETY/STRESS Duration: chronic Status:controlled Anxious mood: no  Excessive worrying: no Irritability: no  Sweating: no Nausea: no Palpitations:no Hyperventilation: no Panic attacks: no Agoraphobia: no  Obscessions/compulsions: no Depressed mood: no    05/16/2023    9:54 AM 12/10/2022   10:34 AM 08/13/2022    9:10 AM 05/13/2022    9:50 AM 11/07/2021    9:54 AM  Depression screen PHQ 2/9  Decreased Interest 1 1 1 1 1   Down, Depressed, Hopeless 0 1 1 1 1   PHQ - 2 Score 1 2 2 2 2   Altered sleeping 1 2 2 1 3   Tired, decreased energy 1 1 1 1 3   Change in appetite 0 0 1 1 1   Feeling bad or failure about yourself  0 0 0 0 0  Trouble concentrating 0 0 0 0 0  Moving slowly or fidgety/restless 0 0 0 0 0  Suicidal thoughts 0 0 0 0 0  PHQ-9 Score 3 5 6 5 9   Difficult doing work/chores Somewhat difficult Somewhat difficult Somewhat difficult Somewhat difficult Somewhat difficult   Anhedonia: no Weight changes: no Insomnia: no   Hypersomnia: no Fatigue/loss of energy: no Feelings of worthlessness: no Feelings of guilt: no Impaired concentration/indecisiveness: no Suicidal ideations: no  Crying spells: no Recent Stressors/Life Changes: no   Relationship problems: no   Family stress: no     Financial stress: no    Job  stress: no    Recent death/loss: no  HYPERLIPIDEMIA Hyperlipidemia status: excellent compliance Satisfied with current treatment?  yes Side effects:  no Medication compliance: excellent compliance Past cholesterol meds: none Supplements: none Aspirin:  no The 10-year ASCVD risk score (Arnett DK, et al., 2019) is: 1.7%   Values used to calculate the score:     Age: 33 years     Sex: Female     Is Non-Hispanic African American: No     Diabetic: No     Tobacco smoker: No     Systolic Blood Pressure: 128 mmHg     Is BP treated: No     HDL Cholesterol: 57 mg/dL     Total Cholesterol: 244 mg/dL Chest pain:  no Coronary artery disease:  no  She currently lives with: husband Menopausal Symptoms: no  Depression Screen done today and results listed below:     05/16/2023    9:54 AM 12/10/2022   10:34 AM 08/13/2022    9:10 AM 05/13/2022    9:50 AM 11/07/2021    9:54 AM  Depression screen PHQ 2/9  Decreased Interest 1 1 1 1 1   Down, Depressed, Hopeless 0 1 1 1 1   PHQ - 2 Score 1 2 2 2 2   Altered sleeping 1 2 2 1 3   Tired, decreased energy 1 1 1 1 3   Change in appetite  0 0 1 1 1   Feeling bad or failure about yourself  0 0 0 0 0  Trouble concentrating 0 0 0 0 0  Moving slowly or fidgety/restless 0 0 0 0 0  Suicidal thoughts 0 0 0 0 0  PHQ-9 Score 3 5 6 5 9   Difficult doing work/chores Somewhat difficult Somewhat difficult Somewhat difficult Somewhat difficult Somewhat difficult    Past Medical History:  Past Medical History:  Diagnosis Date   Anxiety    DDD (degenerative disc disease), lumbar     Surgical History:  Past Surgical History:  Procedure Laterality Date   HERNIA REPAIR      Medications:  Current Outpatient Medications on File Prior to Visit  Medication Sig   cetirizine (ZYRTEC) 10 MG tablet Take 10 mg by mouth daily.   clindamycin (CLEOCIN T) 1 % lotion Apply topically daily as needed.   clonazePAM (KLONOPIN) 0.5 MG tablet Take 0.5-1 tablets (0.25-0.5 mg  total) by mouth 2 (two) times daily as needed for anxiety.   cyclobenzaprine (FLEXERIL) 10 MG tablet TAKE 1 TABLET(10 MG) BY MOUTH AT BEDTIME   fluticasone (FLONASE) 50 MCG/ACT nasal spray Place into the nose.   gabapentin (NEURONTIN) 300 MG capsule Take 300 mg by mouth as needed.   No current facility-administered medications on file prior to visit.    Allergies:  Allergies  Allergen Reactions   Duloxetine Other (See Comments)    GI and weight   Sertraline Other (See Comments)    GI and weight   Trintellix [Vortioxetine] Nausea And Vomiting    Social History:  Social History   Socioeconomic History   Marital status: Married    Spouse name: Not on file   Number of children: Not on file   Years of education: Not on file   Highest education level: Not on file  Occupational History   Not on file  Tobacco Use   Smoking status: Former    Current packs/day: 0.00    Types: Cigarettes    Quit date: 09/06/2009    Years since quitting: 13.6   Smokeless tobacco: Never  Vaping Use   Vaping status: Never Used  Substance and Sexual Activity   Alcohol use: Not Currently   Drug use: No   Sexual activity: Yes    Birth control/protection: None  Other Topics Concern   Not on file  Social History Narrative   Not on file   Social Determinants of Health   Financial Resource Strain: Low Risk  (05/16/2023)   Overall Financial Resource Strain (CARDIA)    Difficulty of Paying Living Expenses: Not hard at all  Food Insecurity: No Food Insecurity (05/16/2023)   Hunger Vital Sign    Worried About Running Out of Food in the Last Year: Never true    Ran Out of Food in the Last Year: Never true  Transportation Needs: No Transportation Needs (05/16/2023)   PRAPARE - Administrator, Civil Service (Medical): No    Lack of Transportation (Non-Medical): No  Physical Activity: Sufficiently Active (05/16/2023)   Exercise Vital Sign    Days of Exercise per Week: 5 days    Minutes of  Exercise per Session: 30 min  Stress: No Stress Concern Present (05/16/2023)   [employer] of Occupational Health - Occupational Stress Questionnaire    Feeling of Stress : Not at all  Social Connections: Socially Isolated (05/16/2023)   Social Connection and Isolation Panel [NHANES]    Frequency of Communication with Friends  and Family: Once a week    Frequency of Social Gatherings with Friends and Family: Once a week    Attends Religious Services: Never    Database administrator or Organizations: No    Attends Engineer, structural: Not on file    Marital Status: Married  Catering manager Violence: Not At Risk (05/16/2023)   Humiliation, Afraid, Rape, and Kick questionnaire    Fear of Current or Ex-Partner: No    Emotionally Abused: No    Physically Abused: No    Sexually Abused: No   Social History   Tobacco Use  Smoking Status Former   Current packs/day: 0.00   Types: Cigarettes   Quit date: 09/06/2009   Years since quitting: 13.6  Smokeless Tobacco Never   Social History   Substance and Sexual Activity  Alcohol Use Not Currently    Family History:  Family History  Problem Relation Age of Onset   Cancer Mother        Brain   Parkinson's disease Father    Breast cancer Maternal Aunt    Parkinson's disease Paternal Grandfather     Past medical history, surgical history, medications, allergies, family history and social history reviewed with patient today and changes made to appropriate areas of the chart.   Review of Systems  Constitutional: Negative.   HENT: Negative.    Eyes: Negative.   Respiratory: Negative.    Cardiovascular: Negative.   Gastrointestinal:  Positive for constipation, nausea and vomiting. Negative for abdominal pain, blood in stool, diarrhea, heartburn and melena.  Genitourinary: Negative.   Musculoskeletal: Negative.   Skin: Negative.   Neurological: Negative.   Endo/Heme/Allergies:  Positive for environmental allergies.  Negative for polydipsia. Does not bruise/bleed easily.  Psychiatric/Behavioral: Negative.     All other ROS negative except what is listed above and in the HPI.      Objective:    BP 128/72   Pulse 81   Ht 5\' 4"  (1.626 m)   Wt 191 lb (86.6 kg)   SpO2 99%   BMI 32.79 kg/m   Wt Readings from Last 3 Encounters:  05/16/23 191 lb (86.6 kg)  12/10/22 221 lb 12.8 oz (100.6 kg)  08/13/22 216 lb 3.2 oz (98.1 kg)    Physical Exam Vitals and nursing note reviewed.  Constitutional:      General: She is not in acute distress.    Appearance: Normal appearance. She is obese. She is not ill-appearing, toxic-appearing or diaphoretic.  HENT:     Head: Normocephalic and atraumatic.     Right Ear: Tympanic membrane, ear canal and external ear normal. There is no impacted cerumen.     Left Ear: Tympanic membrane, ear canal and external ear normal. There is no impacted cerumen.     Nose: Nose normal. No congestion or rhinorrhea.     Mouth/Throat:     Mouth: Mucous membranes are moist.     Pharynx: Oropharynx is clear. No oropharyngeal exudate or posterior oropharyngeal erythema.  Eyes:     General: No scleral icterus.       Right eye: No discharge.        Left eye: No discharge.     Extraocular Movements: Extraocular movements intact.     Conjunctiva/sclera: Conjunctivae normal.     Pupils: Pupils are equal, round, and reactive to light.  Neck:     Vascular: No carotid bruit.  Cardiovascular:     Rate and Rhythm: Normal rate and regular rhythm.  Pulses: Normal pulses.     Heart sounds: No murmur heard.    No friction rub. No gallop.  Pulmonary:     Effort: Pulmonary effort is normal. No respiratory distress.     Breath sounds: Normal breath sounds. No stridor. No wheezing, rhonchi or rales.  Chest:     Chest wall: No tenderness.  Abdominal:     General: Abdomen is flat. Bowel sounds are normal. There is no distension.     Palpations: Abdomen is soft. There is no mass.      Tenderness: There is no abdominal tenderness. There is no right CVA tenderness, left CVA tenderness, guarding or rebound.     Hernia: No hernia is present.  Genitourinary:    Comments: Breast and pelvic exams deferred with shared decision making Musculoskeletal:        General: No swelling, tenderness, deformity or signs of injury.     Cervical back: Normal range of motion and neck supple. No rigidity. No muscular tenderness.     Right lower leg: No edema.     Left lower leg: No edema.  Lymphadenopathy:     Cervical: No cervical adenopathy.  Skin:    General: Skin is warm and dry.     Capillary Refill: Capillary refill takes less than 2 seconds.     Coloration: Skin is not jaundiced or pale.     Findings: No bruising, erythema, lesion or rash.  Neurological:     General: No focal deficit present.     Mental Status: She is alert and oriented to person, place, and time. Mental status is at baseline.     Cranial Nerves: No cranial nerve deficit.     Sensory: No sensory deficit.     Motor: No weakness.     Coordination: Coordination normal.     Gait: Gait normal.     Deep Tendon Reflexes: Reflexes normal.  Psychiatric:        Mood and Affect: Mood normal.        Behavior: Behavior normal.        Thought Content: Thought content normal.        Judgment: Judgment normal.     Results for orders placed or performed in visit on 05/16/23  Microscopic Examination   Urine  Result Value Ref Range   WBC, UA None seen 0 - 5 /hpf   RBC, Urine 0-2 0 - 2 /hpf   Epithelial Cells (non renal) 0-10 0 - 10 /hpf   Bacteria, UA None seen None seen/Few  Urinalysis, Routine w reflex microscopic  Result Value Ref Range   Specific Gravity, UA <1.005 (L) 1.005 - 1.030   pH, UA 5.5 5.0 - 7.5   Color, UA Yellow Yellow   Appearance Ur Cloudy (A) Clear   Leukocytes,UA Negative Negative   Protein,UA Negative Negative/Trace   Glucose, UA Negative Negative   Ketones, UA Negative Negative   RBC, UA  Trace (A) Negative   Bilirubin, UA Negative Negative   Urobilinogen, Ur 0.2 0.2 - 1.0 mg/dL   Nitrite, UA Negative Negative   Microscopic Examination See below:   Bayer DCA Hb A1c Waived  Result Value Ref Range   HB A1C (BAYER DCA - WAIVED) 5.6 4.8 - 5.6 %      Assessment & Plan:   Problem List Items Addressed This Visit       Other   Anxiety disorder    Under good control on current regimen. Continue current regimen. Continue to  monitor. Call with any concerns. Refills given.        HLD (hyperlipidemia)    Rechecking labs today. Await results. Treat as needed.       BMI 32.0-32.9,adult    Congratulated patient on 30lb weight loss for >13% of body weight. Tolerating her wegovy well. Will continue and recheck in 6 months. Call with any concerns.       Other Visit Diagnoses     Routine general medical examination at a health care facility    -  Primary   Vaccines up to date. Screening labs checked today. Pap up to date. Mammo and cologuard ordered. Continue diet and exercise. Call with any concerns.   Relevant Orders   CBC with Differential/Platelet   Comprehensive metabolic panel   Lipid Panel w/o Chol/HDL Ratio   Urinalysis, Routine w reflex microscopic (Completed)   TSH   Bayer DCA Hb A1c Waived (Completed)   Screening for colon cancer       Cologuard ordered today.   Relevant Orders   Cologuard   Encounter for screening mammogram for malignant neoplasm of breast       Mammogram ordered today.   Relevant Orders   MM 3D SCREENING MAMMOGRAM BILATERAL BREAST        Follow up plan: Return in about 6 months (around 11/16/2023).   LABORATORY TESTING:  - Pap smear: up to date  IMMUNIZATIONS:   - Tdap: Tetanus vaccination status reviewed: last tetanus booster within 10 years. - Influenza: Postponed to flu season - Pneumovax: Not applicable - Prevnar: Not applicable - COVID: Up to date - HPV: Not applicable - Shingrix vaccine: Up to  date  SCREENING: -Mammogram: Ordered today  - Colonoscopy: Ordered today   PATIENT COUNSELING:   Advised to take 1 mg of folate supplement per day if capable of pregnancy.   Sexuality: Discussed sexually transmitted diseases, partner selection, use of condoms, avoidance of unintended pregnancy  and contraceptive alternatives.   Advised to avoid cigarette smoking.  I discussed with the patient that most people either abstain from alcohol or drink within safe limits (<=14/week and <=4 drinks/occasion for males, <=7/weeks and <= 3 drinks/occasion for females) and that the risk for alcohol disorders and other health effects rises proportionally with the number of drinks per week and how often a drinker exceeds daily limits.  Discussed cessation/primary prevention of drug use and availability of treatment for abuse.   Diet: Encouraged to adjust caloric intake to maintain  or achieve ideal body weight, to reduce intake of dietary saturated fat and total fat, to limit sodium intake by avoiding high sodium foods and not adding table salt, and to maintain adequate dietary potassium and calcium preferably from fresh fruits, vegetables, and low-fat dairy products.    stressed the importance of regular exercise  Injury prevention: Discussed safety belts, safety helmets, smoke detector, smoking near bedding or upholstery.   Dental health: Discussed importance of regular tooth brushing, flossing, and dental visits.    NEXT PREVENTATIVE PHYSICAL DUE IN 1 YEAR. Return in about 6 months (around 11/16/2023).

## 2023-05-16 NOTE — Assessment & Plan Note (Signed)
Congratulated patient on 30lb weight loss for >13% of body weight. Tolerating her wegovy well. Will continue and recheck in 6 months. Call with any concerns.

## 2023-05-16 NOTE — Assessment & Plan Note (Signed)
Under good control on current regimen. Continue current regimen. Continue to monitor. Call with any concerns. Refills given.   

## 2023-05-17 LAB — LIPID PANEL W/O CHOL/HDL RATIO
Cholesterol, Total: 213 mg/dL — ABNORMAL HIGH (ref 100–199)
HDL: 50 mg/dL (ref >39)
LDL Chol Calc (NIH): 149 mg/dL — ABNORMAL HIGH (ref 0–99)
Triglycerides: 81 mg/dL (ref 0–149)
VLDL Cholesterol Cal: 14 mg/dL (ref 5–40)

## 2023-05-17 LAB — CBC WITH DIFFERENTIAL/PLATELET
Basophils Absolute: 0.1 10*3/uL (ref 0.0–0.2)
Basos: 1 %
EOS (ABSOLUTE): 0.1 10*3/uL (ref 0.0–0.4)
Eos: 2 %
Hematocrit: 48.6 % — ABNORMAL HIGH (ref 34.0–46.6)
Hemoglobin: 15.3 g/dL (ref 11.1–15.9)
Immature Grans (Abs): 0 10*3/uL (ref 0.0–0.1)
Immature Granulocytes: 0 %
Lymphocytes Absolute: 2.6 10*3/uL (ref 0.7–3.1)
Lymphs: 29 %
MCH: 27.9 pg (ref 26.6–33.0)
MCHC: 31.5 g/dL (ref 31.5–35.7)
MCV: 89 fL (ref 79–97)
Monocytes Absolute: 0.6 10*3/uL (ref 0.1–0.9)
Monocytes: 7 %
Neutrophils Absolute: 5.3 10*3/uL (ref 1.4–7.0)
Neutrophils: 61 %
Platelets: 418 10*3/uL (ref 150–450)
RBC: 5.49 x10E6/uL — ABNORMAL HIGH (ref 3.77–5.28)
RDW: 14.5 % (ref 11.7–15.4)
WBC: 8.7 10*3/uL (ref 3.4–10.8)

## 2023-05-17 LAB — COMPREHENSIVE METABOLIC PANEL
ALT: 14 IU/L (ref 0–32)
AST: 20 IU/L (ref 0–40)
Albumin: 4.3 g/dL (ref 3.8–4.9)
Alkaline Phosphatase: 70 IU/L (ref 44–121)
BUN/Creatinine Ratio: 8 — ABNORMAL LOW (ref 9–23)
BUN: 8 mg/dL (ref 6–24)
Bilirubin Total: 0.6 mg/dL (ref 0.0–1.2)
CO2: 21 mmol/L (ref 20–29)
Calcium: 9.6 mg/dL (ref 8.7–10.2)
Chloride: 101 mmol/L (ref 96–106)
Creatinine, Ser: 0.98 mg/dL (ref 0.57–1.00)
Globulin, Total: 2.8 g/dL (ref 1.5–4.5)
Glucose: 80 mg/dL (ref 70–99)
Potassium: 4.2 mmol/L (ref 3.5–5.2)
Sodium: 139 mmol/L (ref 134–144)
Total Protein: 7.1 g/dL (ref 6.0–8.5)
eGFR: 70 mL/min/{1.73_m2} (ref >59)

## 2023-05-17 LAB — TSH: TSH: 1.94 u[IU]/mL (ref 0.450–4.500)

## 2023-05-21 ENCOUNTER — Telehealth: Payer: Self-pay | Admitting: Family Medicine

## 2023-05-21 NOTE — Telephone Encounter (Signed)
Patient dropped off Screening Form to be completed by Dr. Laural Benes. I am placing form in providers folder. Informed patient to allow provider 48-72 hours for completion. Patient wishes to receive a call [phone]

## 2023-05-22 ENCOUNTER — Encounter: Payer: Self-pay | Admitting: Family Medicine

## 2023-05-23 NOTE — Telephone Encounter (Signed)
Pt states this form needs to reach her insurance by tomorrow.  Is there anyone else than can complete this for the pt? (She is asking)

## 2023-05-23 NOTE — Telephone Encounter (Signed)
Paperwork is completed and placed in completed bin. Pt is aware.

## 2023-05-28 ENCOUNTER — Other Ambulatory Visit: Payer: Self-pay | Admitting: Family Medicine

## 2023-05-29 NOTE — Telephone Encounter (Signed)
Requested Prescriptions  Refused Prescriptions Disp Refills   WEGOVY 0.5 MG/0.5ML SOAJ [Pharmacy Med Name: WEGOVY 0.5MG /0.5ML INJ (4 PENS)] 6 mL 1    Sig: ADMINISTER 0.5 MG UNDER THE SKIN 1 TIME A WEEK     Endocrinology:  Diabetes - GLP-1 Receptor Agonists - semaglutide Passed - 05/28/2023  8:14 AM      Passed - HBA1C in normal range and within 180 days    HB A1C (BAYER DCA - WAIVED)  Date Value Ref Range Status  05/16/2023 5.6 4.8 - 5.6 % Final    Comment:             Prediabetes: 5.7 - 6.4          Diabetes: >6.4          Glycemic control for adults with diabetes: <7.0          Passed - Cr in normal range and within 360 days    Creatinine, Ser  Date Value Ref Range Status  05/16/2023 0.98 0.57 - 1.00 mg/dL Final         Passed - Valid encounter within last 6 months    Recent Outpatient Visits           1 week ago Routine general medical examination at a health care facility   [facility] [place], [name], DO   5 months ago Generalized anxiety disorder   [place] [place], [name], DO   9 months ago Mixed hyperlipidemia   [place] [facility] [place], [name], DO   1 year ago Routine general medical examination at a health care facility   [facility] [place], [place], DO   1 year ago Perimenopause   [place] [facility], [name], DO       Future Appointments             In 2 weeks Laural Benes, [name], DO [facility] [facility]   In 5 months Laural Benes, [name], DO [place] [facility]

## 2023-06-02 ENCOUNTER — Telehealth: Payer: Managed Care, Other (non HMO) | Admitting: Family Medicine

## 2023-06-12 ENCOUNTER — Ambulatory Visit: Payer: Managed Care, Other (non HMO) | Admitting: Family Medicine

## 2023-07-17 ENCOUNTER — Encounter: Payer: Self-pay | Admitting: Family Medicine

## 2023-07-19 ENCOUNTER — Telehealth: Payer: Managed Care, Other (non HMO) | Admitting: Physician Assistant

## 2023-07-19 DIAGNOSIS — L243 Irritant contact dermatitis due to cosmetics: Secondary | ICD-10-CM | POA: Diagnosis not present

## 2023-07-19 MED ORDER — PREDNISONE 10 MG (21) PO TBPK
ORAL_TABLET | ORAL | 0 refills | Status: DC
Start: 2023-07-19 — End: 2023-12-16

## 2023-07-19 NOTE — Progress Notes (Signed)
E Visit for Rash  We are sorry that you are not feeling well. Here is how we plan to help!  Based on what you shared with me it looks like you have contact dermatitis.  Contact dermatitis is a skin rash caused by something that touches the skin and causes irritation or inflammation.  Your skin may be red, swollen, dry, cracked, and itch.  The rash should go away in a few days but can last a few weeks. I am prescribing a Prednisone 10 mg daily for 6 days per instructions on the bottle. You may also take Benadryl at bedtime. You may apply Benadryl cream to the affected area if needed for itching. Continue to take Cetirizine during the day. Apply cool compresses to the affected itchy areas. Avoid taking hot showers which can make itching worse. Do not take Naprosyn along with Prednisone which taken together can cause upset stomach.  HOME CARE:  Take cool showers and avoid direct sunlight. Apply cool compress or wet dressings. Take a bath in an oatmeal bath.  Sprinkle content of one Aveeno packet under running faucet with comfortably warm water.  Bathe for 15-20 minutes, 1-2 times daily.  Pat dry with a towel. Do not rub the rash. Use hydrocortisone cream. Take an antihistamine like Benadryl for widespread rashes that itch.  The adult dose of Benadryl is 25-50 mg by mouth 4 times daily. Caution:  This type of medication may cause sleepiness.  Do not drink alcohol, drive, or operate dangerous machinery while taking antihistamines.  Do not take these medications if you have prostate enlargement.  Read package instructions thoroughly on all medications that you take.  GET HELP RIGHT AWAY IF:  Symptoms don't go away after treatment. Severe itching that persists. If you rash spreads or swells. If you rash begins to smell. If it blisters and opens or develops a yellow-brown crust. You develop a fever. You have a sore throat. You become short of breath.  MAKE SURE YOU:  Understand these  instructions. Will watch your condition. Will get help right away if you are not doing well or get worse.  Thank you for choosing an e-visit.  Your e-visit answers were reviewed by a board certified advanced clinical practitioner to complete your personal care plan. Depending upon the condition, your plan could have included both over the counter or prescription medications.  Please review your pharmacy choice. Make sure the pharmacy is open so you can pick up prescription now. If there is a problem, you may contact your provider through [employer] and have the prescription routed to another pharmacy.  Your safety is important to [place]. If you have drug allergies check your prescription carefully.   For the next 24 hours you can use MyChart to ask questions about today's visit, request a non-urgent call back, or ask for a work or school excuse. You will get an email in the next two days asking about your experience. I hope that your e-visit has been valuable and will speed your recovery.   I have spent 5 minutes in review of e-visit questionnaire, review and updating patient chart, medical decision making and response to patient.   Gilberto Better, PA-C

## 2023-07-24 ENCOUNTER — Other Ambulatory Visit: Payer: Self-pay

## 2023-07-25 NOTE — Telephone Encounter (Signed)
6 months called in in August. Should not be due- please check with pharmacy

## 2023-08-13 ENCOUNTER — Other Ambulatory Visit: Payer: Self-pay | Admitting: Family Medicine

## 2023-08-14 NOTE — Telephone Encounter (Signed)
Requested Prescriptions  Pending Prescriptions Disp Refills   WEGOVY 0.5 MG/0.5ML Heidi Taylor Med Name: Heidi Taylor 0.5 MG/0.5ML Subcutaneous Solution Auto-injector] 12 mL 0    Sig: INJECT 1 DOSE SUBCUTANEOUSLY ONCE A WEEK     Endocrinology:  Diabetes - GLP-1 Receptor Agonists - semaglutide Passed - 08/13/2023  6:47 AM      Passed - HBA1C in normal range and within 180 days    HB A1C (BAYER DCA - WAIVED)  Date Value Ref Range Status  05/16/2023 5.6 4.8 - 5.6 % Final    Comment:             Prediabetes: 5.7 - 6.4          Diabetes: >6.4          Glycemic control for adults with diabetes: <7.0          Passed - Cr in normal range and within 360 days    Creatinine, Ser  Date Value Ref Range Status  05/16/2023 0.98 0.57 - 1.00 mg/dL Final         Passed - Valid encounter within last 6 months    Recent Outpatient Visits           3 months ago Routine general medical examination at a health care facility   [facility] [place], [name], DO   8 months ago Generalized anxiety disorder   [place] [facility] [place], [name], DO   1 year ago Mixed hyperlipidemia   [place] [facility] [place], [name], DO   1 year ago Routine general medical examination at a health care facility   [facility] [place], [name], DO   1 year ago Perimenopause   [place] [facility] [place], [name], DO       Future Appointments             In 3 months Laural Benes, [name], DO  [facility]

## 2023-09-16 ENCOUNTER — Encounter: Payer: Self-pay | Admitting: Family Medicine

## 2023-09-19 ENCOUNTER — Other Ambulatory Visit: Payer: Self-pay | Admitting: Family Medicine

## 2023-09-19 MED ORDER — WEGOVY 1 MG/0.5ML ~~LOC~~ SOAJ: 1.0000 mg | SUBCUTANEOUS | 0 refills | Status: DC

## 2023-09-24 ENCOUNTER — Other Ambulatory Visit: Payer: Self-pay | Admitting: Family Medicine

## 2023-09-25 ENCOUNTER — Encounter: Payer: Self-pay | Admitting: Family Medicine

## 2023-09-27 NOTE — Telephone Encounter (Signed)
 Requested medication (s) are due for refill today: yes  Requested medication (s) are on the active medication list: yes  Last refill:  12/10/22 #30 2 RF  Future visit scheduled: yes  Notes to clinic:  Med not delegated to NT to RF   Requested Prescriptions  Pending Prescriptions Disp Refills   clonazePAM  (KLONOPIN ) 0.5 MG tablet [Pharmacy Med Name: CLONAZEPAM  0.5MG  TABLETS] 30 tablet     Sig: TAKE 1/2 TO 1 TABLET(0.25 TO 0.5 MG) BY MOUTH TWICE DAILY AS NEEDED FOR ANXIETY     Not Delegated - Psychiatry: Anxiolytics/Hypnotics 2 Failed - 09/27/2023  2:58 PM      Failed - This refill cannot be delegated      Failed - Urine Drug Screen completed in last 360 days      Passed - Patient is not pregnant      Passed - Valid encounter within last 6 months    Recent Outpatient Visits           4 months ago Routine general medical examination at a health care facility   [facility] [name], DO   9 months ago Generalized anxiety disorder   [place] [facility] [place], [name], DO   1 year ago Mixed hyperlipidemia   [facility] [place], [name], DO   1 year ago Routine general medical examination at a health care facility   [facility] [place], [place], DO   1 year ago Perimenopause   [facility] [place], Duwaine SQUIBB, DO       Future Appointments             In 1 month Vicci, Duwaine SQUIBB, DO [place] [facility]

## 2023-09-29 NOTE — Telephone Encounter (Signed)
 Last office visit: 05/16/2023

## 2023-09-30 MED ORDER — CLONAZEPAM 0.5 MG PO TABS: 0.2500 mg | ORAL_TABLET | Freq: Two times a day (BID) | ORAL | 0 refills | Status: DC | PRN

## 2023-10-30 ENCOUNTER — Encounter: Payer: Self-pay | Admitting: Family Medicine

## 2023-10-30 ENCOUNTER — Ambulatory Visit: Payer: Self-pay

## 2023-10-30 NOTE — Telephone Encounter (Signed)
 Message from [place] M sent at 10/30/2023 11:26 AM EST  Summary: questions regarding Rx for Wegovy    Cindi with [employer] request call back because she has some questions regarding the Rx for Wegovy . Cb# [phone]         Chief Complaint: spoke with Lovei at [facility] who advised the Wegovy  not approved. Stated will fax a refusal form to clinic.   Disposition: [] ED /[] Urgent Care (no appt availability in office) / [] Appointment(In office/virtual)/ []  [facility]/ [] Home Care/ [] Refused Recommended Disposition /[]  Mobile Bus/ [x]  Follow-up with PCP Additional Notes:   Reason for Disposition  General information question, no triage required and triager able to answer question  Answer Assessment - Initial Assessment Questions 1. REASON FOR CALL or QUESTION: What is your reason for calling today? or How can I best help you? or What question do you have that I can help answer?     Message from [place] M sent at 10/30/2023 11:26 AM EST  Summary: questions regarding Rx for Wegovy    Cindi with [facility] request call back because she has some questions regarding the Rx for Wegovy . Cb# [phone]  Protocols used: Information Only Call - No Triage-A-AH

## 2023-11-17 ENCOUNTER — Ambulatory Visit: Payer: Managed Care, Other (non HMO) | Admitting: Family Medicine

## 2023-12-16 ENCOUNTER — Encounter: Payer: Self-pay | Admitting: Family Medicine

## 2023-12-16 ENCOUNTER — Ambulatory Visit: Payer: Managed Care, Other (non HMO) | Admitting: Family Medicine

## 2023-12-16 VITALS — BP 106/71 | HR 80 | Temp 98.6°F | Resp 16 | Ht 64.02 in | Wt 169.2 lb

## 2023-12-16 DIAGNOSIS — E782 Mixed hyperlipidemia: Secondary | ICD-10-CM | POA: Diagnosis not present

## 2023-12-16 DIAGNOSIS — E663 Overweight: Secondary | ICD-10-CM | POA: Diagnosis not present

## 2023-12-16 DIAGNOSIS — F411 Generalized anxiety disorder: Secondary | ICD-10-CM

## 2023-12-16 MED ORDER — BUPROPION HCL ER (SR) 150 MG PO TB12: ORAL_TABLET | ORAL | 2 refills | Status: DC

## 2023-12-16 MED ORDER — WEGOVY 0.25 MG/0.5ML ~~LOC~~ SOAJ
0.2500 mg | SUBCUTANEOUS | 2 refills | Status: AC
Start: 2023-12-16 — End: ?

## 2023-12-16 MED ORDER — CLONAZEPAM 0.5 MG PO TABS
0.2500 mg | ORAL_TABLET | Freq: Two times a day (BID) | ORAL | 0 refills | Status: AC | PRN
Start: 2023-12-16 — End: ?

## 2023-12-16 NOTE — Assessment & Plan Note (Signed)
 Has lost 52lbs on wegovy. Has been off meds for 2 months due to insurance issues. Will restart. Follow up 6 weeks.

## 2023-12-16 NOTE — Assessment & Plan Note (Signed)
 Rechecking labs today. Await results. Treat as needed.

## 2023-12-16 NOTE — Patient Instructions (Signed)
 Please call to schedule your mammogram: [facility] at [facility]  Address: [address], [place], [place] [address] Phone: [phone]  [facility] at [facility] [address] [place],  [place]  [address] Phone: [phone]

## 2023-12-16 NOTE — Assessment & Plan Note (Signed)
 Acting up. Will restart wellbutrin and recheck in 6 weeks. Refill of klonopin given today. Call with any concerns.

## 2023-12-16 NOTE — Progress Notes (Signed)
 BP 106/71 (BP Location: Left Arm, Patient Position: Sitting, Cuff Size: Large)   Pulse 80   Temp 98.6 F (37 C) (Oral)   Resp 16   Ht 5' 4.02" (1.626 m)   Wt 169 lb 3.2 oz (76.7 kg)   LMP  (LMP Unknown)   SpO2 99%   BMI 29.03 kg/m    Subjective:    Patient ID: Heidi Taylor, female    DOB: 11-26-1970, 53 y.o.   MRN: [id number]  HPI: Heidi Taylor is a 53 y.o. female  Chief Complaint  Patient presents with   Obesity    Down 22 pounds. Needs to renew [facility].    Anxiety    Pretty bad. Home and world stuff.    OBESITY- has been off meds for about 2 months Duration: chronic Previous attempts at weight loss: yes Complications of obesity: HLD, depression, lumbar DJD  Peak weight: 221lbs Weight loss goal: 150lbs Weight loss to date: 52lbs! Requesting obesity pharmacotherapy: yes Current weight loss supplements/medications: no Past weight loss medications: yes- wegovy until about 2 months ago  ANXIETY/DEPRESSION Duration: Chronic Status: exacerbated Anxious mood: yes  Excessive worrying: yes Irritability: no  Sweating: no Nausea: no Palpitations:no Hyperventilation: no Panic attacks: no Agoraphobia: no  Obscessions/compulsions: no Depressed mood: yes    12/16/2023    9:13 AM 05/16/2023    9:54 AM 12/10/2022   10:34 AM 08/13/2022    9:10 AM 05/13/2022    9:50 AM  Depression screen PHQ 2/9  Decreased Interest 2 1 1 1 1   Down, Depressed, Hopeless 1 0 1 1 1   PHQ - 2 Score 3 1 2 2 2   Altered sleeping 3 1 2 2 1   Tired, decreased energy 1 1 1 1 1   Change in appetite 2 0 0 1 1  Feeling bad or failure about yourself  0 0 0 0 0  Trouble concentrating 0 0 0 0 0  Moving slowly or fidgety/restless 0 0 0 0 0  Suicidal thoughts 0 0 0 0 0  PHQ-9 Score 9 3 5 6 5   Difficult doing work/chores Somewhat difficult Somewhat difficult Somewhat difficult Somewhat difficult Somewhat difficult      12/16/2023    9:13 AM 05/16/2023    9:54 AM 12/10/2022   10:34 AM 08/13/2022     9:10 AM  GAD 7 : Generalized Anxiety Score  Nervous, Anxious, on Edge 3 1 1 2   Control/stop worrying 3 0 0 0  Worry too much - different things 3 0 0 1  Trouble relaxing 2 1 1 1   Restless 1 0 0 0  Easily annoyed or irritable 1 0 1 1  Afraid - awful might happen 2 0 0 0  Total GAD 7 Score 15 2 3 5   Anxiety Difficulty Very difficult Somewhat difficult Somewhat difficult Somewhat difficult   Anhedonia: no Weight changes: yes Insomnia: no   Hypersomnia: no Fatigue/loss of energy: no Feelings of worthlessness: no Feelings of guilt: no Impaired concentration/indecisiveness: no Suicidal ideations: no  Crying spells: no Recent Stressors/Life Changes: no   Relationship problems: no   Family stress: no     Financial stress: no    Job stress: no    Recent death/loss: no  HYPERLIPIDEMIA Hyperlipidemia status: Stable Satisfied with current treatment?  yes Side effects:  N/A Medication compliance: N/A Past cholesterol meds: none Supplements: none Aspirin:  no The 10-year ASCVD risk score (Arnett DK, et al., 2019) is: 1.2%   Values used to calculate the score:  Age: 89 years     Sex: Female     Is Non-Hispanic African American: No     Diabetic: No     Tobacco smoker: No     Systolic Blood Pressure: 106 mmHg     Is BP treated: No     HDL Cholesterol: 50 mg/dL     Total Cholesterol: 213 mg/dL Chest pain:  no Coronary artery disease:  no  Clinical coverage for weight loss GLP's  Medication being dispensed is Wegovy 3 mL/28 day. Titration doses are 2 mL/28 days.   [x]  Product being prescribed is FDA approved for the indication, age, weight (if applicable) and not does not exceed dosing limits per the Prescribing Information per the clinical conditions for use.  [x]  Patient's baseline weight measured within the last 45 days as required by provider before dispensing.  [x]  Patient is new to therapy and One of the following:   [x]  The beneficiary is 52 years of age or over  and has ONE of the following:  []  A BMI greater than or equal to 30 kg/m2  [x]  A BMI greater than or equal to 27 kg/m2 with at least one weight-related comorbidity/risk factor/complication (i.e. hypertension, type 2 diabetes, obstructive sleep apnea, cardiovascular disease, dyslipidemia)  [x]  If patient has one weight-related comorbidity/risk factor/complication (i.e. hypertension, type 2 diabetes, obstructive sleep apnea, cardiovascular disease, dyslipidemia), please list  Patient suffers from weight-related comorbidity/risk factor/complication HLD, depression, lumbar DJD   [x]  The beneficiary is currently on and will continue lifestyle modification including structured nutrition and physical activity, unless physical activity is not clinically appropriate at the time GLP1 therapy commences AND  [x]  The beneficiary will NOT be using the requested agent in combination with another GLP-1 receptor agonist agent AND  [x]  The beneficiary does NOT have any FDA-labeled contraindications to the requested agent, including pregnancy, lactation, history of medullary thyroid cancer or multiple endocrine neoplasia type II.   Last BMI/Weight/Height recorded Estimated body mass index is 29.03 kg/m as calculated from the following:   Height as of this encounter: 5' 4.02" (1.626 m).   Weight as of this encounter: 169 lb 3.2 oz (76.7 kg).     Relevant past medical, surgical, family and social history reviewed and updated as indicated. Interim medical history since our last visit reviewed. Allergies and medications reviewed and updated.  Review of Systems  Constitutional: Negative.   Respiratory: Negative.    Cardiovascular: Negative.   Neurological: Negative.   Psychiatric/Behavioral:  Positive for dysphoric mood. Negative for agitation, behavioral problems, confusion, decreased concentration, hallucinations, self-injury, sleep disturbance and suicidal ideas. The patient is nervous/anxious. The  patient is not hyperactive.     Per HPI unless specifically indicated above     Objective:    BP 106/71 (BP Location: Left Arm, Patient Position: Sitting, Cuff Size: Large)   Pulse 80   Temp 98.6 F (37 C) (Oral)   Resp 16   Ht 5' 4.02" (1.626 m)   Wt 169 lb 3.2 oz (76.7 kg)   LMP  (LMP Unknown)   SpO2 99%   BMI 29.03 kg/m   Wt Readings from Last 3 Encounters:  12/16/23 169 lb 3.2 oz (76.7 kg)  05/16/23 191 lb (86.6 kg)  12/10/22 221 lb 12.8 oz (100.6 kg)    Physical Exam Vitals and nursing note reviewed.  Constitutional:      General: She is not in acute distress.    Appearance: Normal appearance. She is not ill-appearing, toxic-appearing or diaphoretic.  HENT:     Head: Normocephalic and atraumatic.     Right Ear: External ear normal.     Left Ear: External ear normal.     Nose: Nose normal.     Mouth/Throat:     Mouth: Mucous membranes are moist.     Pharynx: Oropharynx is clear.  Eyes:     General: No scleral icterus.       Right eye: No discharge.        Left eye: No discharge.     Extraocular Movements: Extraocular movements intact.     Conjunctiva/sclera: Conjunctivae normal.     Pupils: Pupils are equal, round, and reactive to light.  Cardiovascular:     Rate and Rhythm: Normal rate and regular rhythm.     Pulses: Normal pulses.     Heart sounds: Normal heart sounds. No murmur heard.    No friction rub. No gallop.  Pulmonary:     Effort: Pulmonary effort is normal. No respiratory distress.     Breath sounds: Normal breath sounds. No stridor. No wheezing, rhonchi or rales.  Chest:     Chest wall: No tenderness.  Musculoskeletal:        General: Normal range of motion.     Cervical back: Normal range of motion and neck supple.  Skin:    General: Skin is warm and dry.     Capillary Refill: Capillary refill takes less than 2 seconds.     Coloration: Skin is not jaundiced or pale.     Findings: No bruising, erythema, lesion or rash.  Neurological:      General: No focal deficit present.     Mental Status: She is alert and oriented to person, place, and time. Mental status is at baseline.  Psychiatric:        Mood and Affect: Mood normal.        Behavior: Behavior normal.        Thought Content: Thought content normal.        Judgment: Judgment normal.     Results for orders placed or performed in visit on 05/16/23  Microscopic Examination   Collection Time: 05/16/23 10:08 AM   Urine  Result Value Ref Range   WBC, UA None seen 0 - 5 /hpf   RBC, Urine 0-2 0 - 2 /hpf   Epithelial Cells (non renal) 0-10 0 - 10 /hpf   Bacteria, UA None seen None seen/Few  Urinalysis, Routine w reflex microscopic   Collection Time: 05/16/23 10:08 AM  Result Value Ref Range   Specific Gravity, UA <1.005 (L) 1.005 - 1.030   pH, UA 5.5 5.0 - 7.5   Color, UA Yellow Yellow   Appearance Ur Cloudy (A) Clear   Leukocytes,UA Negative Negative   Protein,UA Negative Negative/Trace   Glucose, UA Negative Negative   Ketones, UA Negative Negative   RBC, UA Trace (A) Negative   Bilirubin, UA Negative Negative   Urobilinogen, Ur 0.2 0.2 - 1.0 mg/dL   Nitrite, UA Negative Negative   Microscopic Examination See below:   Bayer DCA Hb A1c Waived   Collection Time: 05/16/23 10:08 AM  Result Value Ref Range   HB A1C (BAYER DCA - WAIVED) 5.6 4.8 - 5.6 %  CBC with Differential/Platelet   Collection Time: 05/16/23 10:18 AM  Result Value Ref Range   WBC 8.7 3.4 - 10.8 x10E3/uL   RBC 5.49 (H) 3.77 - 5.28 x10E6/uL   Hemoglobin 15.3 11.1 - 15.9 g/dL   Hematocrit  48.6 (H) 34.0 - 46.6 %   MCV 89 79 - 97 fL   MCH 27.9 26.6 - 33.0 pg   MCHC 31.5 31.5 - 35.7 g/dL   RDW 16.1 09.6 - 04.5 %   Platelets 418 150 - 450 x10E3/uL   Neutrophils 61 Not Estab. %   Lymphs 29 Not Estab. %   Monocytes 7 Not Estab. %   Eos 2 Not Estab. %   Basos 1 Not Estab. %   Neutrophils Absolute 5.3 1.4 - 7.0 x10E3/uL   Lymphocytes Absolute 2.6 0.7 - 3.1 x10E3/uL   Monocytes Absolute 0.6  0.1 - 0.9 x10E3/uL   EOS (ABSOLUTE) 0.1 0.0 - 0.4 x10E3/uL   Basophils Absolute 0.1 0.0 - 0.2 x10E3/uL   Immature Granulocytes 0 Not Estab. %   Immature Grans (Abs) 0.0 0.0 - 0.1 x10E3/uL  Comprehensive metabolic panel   Collection Time: 05/16/23 10:18 AM  Result Value Ref Range   Glucose 80 70 - 99 mg/dL   BUN 8 6 - 24 mg/dL   Creatinine, Ser 4.09 0.57 - 1.00 mg/dL   eGFR 70 >81 XB/JYN/8.29   BUN/Creatinine Ratio 8 (L) 9 - 23   Sodium 139 134 - 144 mmol/L   Potassium 4.2 3.5 - 5.2 mmol/L   Chloride 101 96 - 106 mmol/L   CO2 21 20 - 29 mmol/L   Calcium 9.6 8.7 - 10.2 mg/dL   Total Protein 7.1 6.0 - 8.5 g/dL   Albumin 4.3 3.8 - 4.9 g/dL   Globulin, Total 2.8 1.5 - 4.5 g/dL   Bilirubin Total 0.6 0.0 - 1.2 mg/dL   Alkaline Phosphatase 70 44 - 121 IU/L   AST 20 0 - 40 IU/L   ALT 14 0 - 32 IU/L  Lipid Panel w/o Chol/HDL Ratio   Collection Time: 05/16/23 10:18 AM  Result Value Ref Range   Cholesterol, Total 213 (H) 100 - 199 mg/dL   Triglycerides 81 0 - 149 mg/dL   HDL 50 >56 mg/dL   VLDL Cholesterol Cal 14 5 - 40 mg/dL   LDL Chol Calc (NIH) 213 (H) 0 - 99 mg/dL  TSH   Collection Time: 05/16/23 10:18 AM  Result Value Ref Range   TSH 1.940 0.450 - 4.500 uIU/mL      Assessment & Plan:   Problem List Items Addressed This Visit       Other   Anxiety disorder - Primary   Acting up. Will restart wellbutrin and recheck in 6 weeks. Refill of klonopin given today. Call with any concerns.       Relevant Medications   buPROPion (WELLBUTRIN SR) 150 MG 12 hr tablet   HLD (hyperlipidemia)   Rechecking labs today. Await results. Treat as needed.       Relevant Orders   Lipid Panel w/o Chol/HDL Ratio   CBC with Differential/Platelet   Comprehensive metabolic panel   Overweight (BMI 25.0-29.9)   Has lost 52lbs on wegovy. Has been off meds for 2 months due to insurance issues. Will restart. Follow up 6 weeks.        Follow up plan: Return in about 6 weeks (around  01/27/2024).

## 2023-12-17 ENCOUNTER — Encounter: Payer: Self-pay | Admitting: Family Medicine

## 2023-12-17 LAB — CBC WITH DIFFERENTIAL/PLATELET
Basophils Absolute: 0.1 10*3/uL (ref 0.0–0.2)
Basos: 1 %
EOS (ABSOLUTE): 0.3 10*3/uL (ref 0.0–0.4)
Eos: 3 %
Hematocrit: 43.5 % (ref 34.0–46.6)
Hemoglobin: 13.9 g/dL (ref 11.1–15.9)
Immature Grans (Abs): 0 10*3/uL (ref 0.0–0.1)
Immature Granulocytes: 0 %
Lymphocytes Absolute: 3.1 10*3/uL (ref 0.7–3.1)
Lymphs: 32 %
MCH: 28.6 pg (ref 26.6–33.0)
MCHC: 32 g/dL (ref 31.5–35.7)
MCV: 90 fL (ref 79–97)
Monocytes Absolute: 0.6 10*3/uL (ref 0.1–0.9)
Monocytes: 6 %
Neutrophils Absolute: 5.6 10*3/uL (ref 1.4–7.0)
Neutrophils: 58 %
Platelets: 465 10*3/uL — ABNORMAL HIGH (ref 150–450)
RBC: 4.86 x10E6/uL (ref 3.77–5.28)
RDW: 13.1 % (ref 11.7–15.4)
WBC: 9.7 10*3/uL (ref 3.4–10.8)

## 2023-12-17 LAB — COMPREHENSIVE METABOLIC PANEL
ALT: 18 IU/L (ref 0–32)
AST: 23 IU/L (ref 0–40)
Albumin: 4.3 g/dL (ref 3.8–4.9)
Alkaline Phosphatase: 75 IU/L (ref 44–121)
BUN/Creatinine Ratio: 18 (ref 9–23)
BUN: 16 mg/dL (ref 6–24)
Bilirubin Total: 0.4 mg/dL (ref 0.0–1.2)
CO2: 22 mmol/L (ref 20–29)
Calcium: 9.7 mg/dL (ref 8.7–10.2)
Chloride: 103 mmol/L (ref 96–106)
Creatinine, Ser: 0.89 mg/dL (ref 0.57–1.00)
Globulin, Total: 2.8 g/dL (ref 1.5–4.5)
Glucose: 99 mg/dL (ref 70–99)
Potassium: 4.6 mmol/L (ref 3.5–5.2)
Sodium: 143 mmol/L (ref 134–144)
Total Protein: 7.1 g/dL (ref 6.0–8.5)
eGFR: 78 mL/min/{1.73_m2} (ref >59)

## 2023-12-17 LAB — LIPID PANEL W/O CHOL/HDL RATIO
Cholesterol, Total: 230 mg/dL — ABNORMAL HIGH (ref 100–199)
HDL: 63 mg/dL (ref >39)
LDL Chol Calc (NIH): 153 mg/dL — ABNORMAL HIGH (ref 0–99)
Triglycerides: 83 mg/dL (ref 0–149)
VLDL Cholesterol Cal: 14 mg/dL (ref 5–40)

## 2023-12-19 NOTE — Telephone Encounter (Signed)
 Please see other thread discussion with patient.

## 2023-12-19 NOTE — Telephone Encounter (Signed)
 Spoke further with patient. She is aware that the current denial will mean that we must call insurance to see if her plan covers any GLP based on cardiovascular protection.

## 2023-12-30 ENCOUNTER — Encounter: Payer: Self-pay | Admitting: Family Medicine

## 2023-12-31 ENCOUNTER — Encounter: Payer: Self-pay | Admitting: Family Medicine

## 2023-12-31 LAB — COLOGUARD: COLOGUARD: NEGATIVE

## 2024-01-06 ENCOUNTER — Encounter: Payer: Self-pay | Admitting: Family Medicine

## 2024-01-06 NOTE — Telephone Encounter (Signed)
 Copied from CRM [id number]. Topic: Clinical - Prescription Issue >> Jan 02, 2024  1:36 PM Phil Braun wrote: Reason for CRM:   ZDG:LOVFIEPPIRJ-JOACZY Management (WEGOVY) 0.25 MG/0.5ML SOAJ   Pt stated that she spoke with her representative and if this medication is listed on letter of necessity it would be approved.   -Prior Burt Casco  -To let them know it was a long term gap in getting the rx filled.  -She wants me to restart the treatment. This is a restart of treatment.

## 2024-01-07 NOTE — Telephone Encounter (Signed)
 See other thread

## 2024-01-09 ENCOUNTER — Encounter: Payer: Self-pay | Admitting: Family Medicine

## 2024-01-09 ENCOUNTER — Telehealth: Payer: Self-pay | Admitting: Family Medicine

## 2024-01-09 NOTE — Telephone Encounter (Signed)
 Copied from CRM [id number]. Topic: General - Other >> Jan 09, 2024  9:34 AM Marissa P wrote: Reason for CRM: Polly Brink from [facility] called following up on an appeal for the prescription for this patient (Wegovy : Semaglutide -Weight Management (WEGOVY ) 0.25 MG/0.5ML SOAJ). He stated that  [employer] rx cancelled/ denied the appeal because of how it was submitted. Polly Brink advised that if we use this ref# [id number] in the portal, it should submit properly. He also advised that he seen a user in the portal by the same of Katie Lumpkins, not sure if that's who handles the appeal but wanted to get the message over. Callback # for any questions: [phone].

## 2024-01-09 NOTE — Telephone Encounter (Signed)
 Appeal is in process directly with company. No further need here.

## 2024-01-29 ENCOUNTER — Encounter: Payer: Self-pay | Admitting: Family Medicine

## 2024-01-30 ENCOUNTER — Other Ambulatory Visit: Payer: Self-pay | Admitting: Family Medicine

## 2024-01-30 ENCOUNTER — Encounter (HOSPITAL_COMMUNITY): Payer: Self-pay

## 2024-02-02 NOTE — Telephone Encounter (Signed)
 Requested Prescriptions  Pending Prescriptions Disp Refills   buPROPion  (WELLBUTRIN  SR) 150 MG 12 hr tablet [Pharmacy Med Name: buPROPion  HCl ER (SR) 150 MG Oral Tablet Extended Release 12 Hour] 180 tablet 0    Sig: TAKE 1 TABLET BY MOUTH TWICE  DAILY     Psychiatry: Antidepressants - bupropion  Passed - 02/02/2024 12:03 PM      Passed - Cr in normal range and within 360 days    Creatinine, Ser  Date Value Ref Range Status  12/16/2023 0.89 0.57 - 1.00 mg/dL Final         Passed - AST in normal range and within 360 days    AST  Date Value Ref Range Status  12/16/2023 23 0 - 40 IU/L Final         Passed - ALT in normal range and within 360 days    ALT  Date Value Ref Range Status  12/16/2023 18 0 - 32 IU/L Final         Passed - Last BP in normal range    BP Readings from Last 1 Encounters:  12/16/23 106/71         Passed - Valid encounter within last 6 months    Recent Outpatient Visits           1 month ago Generalized anxiety disorder   [facility] [place], [place], DO

## 2024-02-05 ENCOUNTER — Encounter: Payer: Self-pay | Admitting: Family Medicine

## 2024-02-06 ENCOUNTER — Ambulatory Visit: Admitting: Family Medicine

## 2024-02-10 NOTE — Telephone Encounter (Signed)
 This encounter was created in error - please disregard.

## 2024-02-18 ENCOUNTER — Other Ambulatory Visit: Payer: Self-pay | Admitting: Family Medicine

## 2024-02-20 ENCOUNTER — Encounter: Payer: Self-pay | Admitting: Family Medicine

## 2024-02-20 MED ORDER — CYCLOBENZAPRINE HCL 10 MG PO TABS: ORAL_TABLET | ORAL | 6 refills | Status: AC

## 2024-02-25 NOTE — Telephone Encounter (Signed)
 Copied from CRM [id number]. Topic: General - Other >> Feb 25, 2024 12:43 PM Emylou G wrote: Reason for CRM: patient called adv wegovy  has been filled

## 2024-03-08 ENCOUNTER — Encounter: Payer: Self-pay | Admitting: Family Medicine

## 2024-03-08 ENCOUNTER — Ambulatory Visit: Admitting: Family Medicine

## 2024-03-08 ENCOUNTER — Other Ambulatory Visit: Payer: Self-pay | Admitting: Family Medicine

## 2024-03-08 VITALS — BP 116/75 | HR 84 | Ht 64.0 in | Wt 168.2 lb

## 2024-03-08 DIAGNOSIS — Z0000 Encounter for general adult medical examination without abnormal findings: Secondary | ICD-10-CM

## 2024-03-08 DIAGNOSIS — F411 Generalized anxiety disorder: Secondary | ICD-10-CM

## 2024-03-08 DIAGNOSIS — E663 Overweight: Secondary | ICD-10-CM

## 2024-03-08 MED ORDER — BUPROPION HCL ER (XL) 150 MG PO TB24: 150.0000 mg | ORAL_TABLET | Freq: Every day | ORAL | 1 refills | Status: DC

## 2024-03-08 MED ORDER — CLONAZEPAM 0.5 MG PO TABS: 0.2500 mg | ORAL_TABLET | Freq: Two times a day (BID) | ORAL | 0 refills | Status: DC | PRN

## 2024-03-08 MED ORDER — ONDANSETRON HCL 4 MG PO TABS: 4.0000 mg | ORAL_TABLET | Freq: Three times a day (TID) | ORAL | 3 refills | Status: DC | PRN

## 2024-03-08 MED ORDER — WEGOVY 0.25 MG/0.5ML ~~LOC~~ SOAJ: 0.2500 mg | SUBCUTANEOUS | 1 refills | Status: DC

## 2024-03-08 NOTE — Assessment & Plan Note (Signed)
 Tolerating her wegovy  well. Continue wegovy . Call with any concerns. Refills given.

## 2024-03-08 NOTE — Assessment & Plan Note (Signed)
 Under good control on current regimen. Continue current regimen. Continue to monitor. Call with any concerns. Refills given for 3 months. Follow up 3 months.

## 2024-03-08 NOTE — Patient Instructions (Signed)
 Please call to schedule your mammogram and/or bone density: [facility]  Address: [address], [place], [place] [address] Phone: [phone]  [facility] [address] [place],  [place]  [address] Phone: [phone]

## 2024-03-08 NOTE — Progress Notes (Signed)
 BP 116/75 (BP Location: Left Arm, Patient Position: Sitting, Cuff Size: Normal)   Pulse 84   Ht 5' 4 (1.626 m)   Wt 168 lb 3.2 oz (76.3 kg)   SpO2 98%   BMI 28.87 kg/m    Subjective:    Patient ID: Heidi Taylor, female    DOB: 1971-01-09, 53 y.o.   MRN: [id number]  HPI: Heidi Taylor is a 53 y.o. female  Chief Complaint  Patient presents with   Anxiety   Weight Check   OVERWEIGHT Duration: chronic Previous attempts at weight loss: yes Complications of obesity: HLD, depression, lumbar DJD  Peak weight: 221lbs Weight loss goal: 150lbs Weight loss to date: 53lbs! Requesting obesity pharmacotherapy: yes Current weight loss supplements/medications: yes Past weight loss medications: yes- wegovy   ANXIETY/DEPRESSION Duration: chronic Status:controlled Anxious mood: no  Excessive worrying: no Irritability: no  Sweating: no Nausea: no Palpitations:no Hyperventilation: no Panic attacks: no Agoraphobia: no  Obscessions/compulsions: no Depressed mood: no    03/08/2024    1:30 PM 12/16/2023    9:13 AM 05/16/2023    9:54 AM 12/10/2022   10:34 AM 08/13/2022    9:10 AM  Depression screen PHQ 2/9  Decreased Interest 1 2 1 1 1   Down, Depressed, Hopeless 1 1 0 1 1  PHQ - 2 Score 2 3 1 2 2   Altered sleeping 1 3 1 2 2   Tired, decreased energy 2 1 1 1 1   Change in appetite 1 2 0 0 1  Feeling bad or failure about yourself  0 0 0 0 0  Trouble concentrating 0 0 0 0 0  Moving slowly or fidgety/restless 0 0 0 0 0  Suicidal thoughts 0 0 0 0 0  PHQ-9 Score 6 9 3 5 6   Difficult doing work/chores Somewhat difficult Somewhat difficult Somewhat difficult Somewhat difficult Somewhat difficult   Anhedonia: no Weight changes: no Insomnia: no   Hypersomnia: no Fatigue/loss of energy: no Feelings of worthlessness: no Feelings of guilt: no Impaired concentration/indecisiveness: no Suicidal ideations: no  Crying spells: no Recent Stressors/Life Changes: no   Relationship  problems: no   Family stress: no     Financial stress: no    Job stress: no    Recent death/loss: no  Relevant past medical, surgical, family and social history reviewed and updated as indicated. Interim medical history since our last visit reviewed. Allergies and medications reviewed and updated.  Review of Systems  Constitutional: Negative.   Respiratory: Negative.    Cardiovascular: Negative.   Genitourinary: Negative.   Musculoskeletal: Negative.   Neurological: Negative.   Psychiatric/Behavioral: Negative.      Per HPI unless specifically indicated above     Objective:    BP 116/75 (BP Location: Left Arm, Patient Position: Sitting, Cuff Size: Normal)   Pulse 84   Ht 5' 4 (1.626 m)   Wt 168 lb 3.2 oz (76.3 kg)   SpO2 98%   BMI 28.87 kg/m   Wt Readings from Last 3 Encounters:  03/08/24 168 lb 3.2 oz (76.3 kg)  12/16/23 169 lb 3.2 oz (76.7 kg)  05/16/23 191 lb (86.6 kg)    Physical Exam Vitals and nursing note reviewed.  Constitutional:      General: She is not in acute distress.    Appearance: Normal appearance. She is not ill-appearing, toxic-appearing or diaphoretic.  HENT:     Head: Normocephalic and atraumatic.     Right Ear: External ear normal.     Left Ear: External  ear normal.     Nose: Nose normal.     Mouth/Throat:     Mouth: Mucous membranes are moist.     Pharynx: Oropharynx is clear.   Eyes:     General: No scleral icterus.       Right eye: No discharge.        Left eye: No discharge.     Extraocular Movements: Extraocular movements intact.     Conjunctiva/sclera: Conjunctivae normal.     Pupils: Pupils are equal, round, and reactive to light.    Cardiovascular:     Rate and Rhythm: Normal rate and regular rhythm.     Pulses: Normal pulses.     Heart sounds: Normal heart sounds. No murmur heard.    No friction rub. No gallop.  Pulmonary:     Effort: Pulmonary effort is normal. No respiratory distress.     Breath sounds: Normal breath  sounds. No stridor. No wheezing, rhonchi or rales.  Chest:     Chest wall: No tenderness.   Musculoskeletal:        General: Normal range of motion.     Cervical back: Normal range of motion and neck supple.   Skin:    General: Skin is warm and dry.     Capillary Refill: Capillary refill takes less than 2 seconds.     Coloration: Skin is not jaundiced or pale.     Findings: No bruising, erythema, lesion or rash.   Neurological:     General: No focal deficit present.     Mental Status: She is alert and oriented to person, place, and time. Mental status is at baseline.   Psychiatric:        Mood and Affect: Mood normal.        Behavior: Behavior normal.        Thought Content: Thought content normal.        Judgment: Judgment normal.     Results for orders placed or performed in visit on 12/16/23  Lipid Panel w/o Chol/HDL Ratio   Collection Time: 12/16/23  9:31 AM  Result Value Ref Range   Cholesterol, Total 230 (H) 100 - 199 mg/dL   Triglycerides 83 0 - 149 mg/dL   HDL 63 >29 mg/dL   VLDL Cholesterol Cal 14 5 - 40 mg/dL   LDL Chol Calc (NIH) 562 (H) 0 - 99 mg/dL  CBC with Differential/Platelet   Collection Time: 12/16/23  9:31 AM  Result Value Ref Range   WBC 9.7 3.4 - 10.8 x10E3/uL   RBC 4.86 3.77 - 5.28 x10E6/uL   Hemoglobin 13.9 11.1 - 15.9 g/dL   Hematocrit 13.0 86.5 - 46.6 %   MCV 90 79 - 97 fL   MCH 28.6 26.6 - 33.0 pg   MCHC 32.0 31.5 - 35.7 g/dL   RDW 78.4 69.6 - 29.5 %   Platelets 465 (H) 150 - 450 x10E3/uL   Neutrophils 58 Not Estab. %   Lymphs 32 Not Estab. %   Monocytes 6 Not Estab. %   Eos 3 Not Estab. %   Basos 1 Not Estab. %   Neutrophils Absolute 5.6 1.4 - 7.0 x10E3/uL   Lymphocytes Absolute 3.1 0.7 - 3.1 x10E3/uL   Monocytes Absolute 0.6 0.1 - 0.9 x10E3/uL   EOS (ABSOLUTE) 0.3 0.0 - 0.4 x10E3/uL   Basophils Absolute 0.1 0.0 - 0.2 x10E3/uL   Immature Granulocytes 0 Not Estab. %   Immature Grans (Abs) 0.0 0.0 - 0.1 x10E3/uL  Comprehensive  metabolic panel   Collection Time: 12/16/23  9:31 AM  Result Value Ref Range   Glucose 99 70 - 99 mg/dL   BUN 16 6 - 24 mg/dL   Creatinine, Ser 4.09 0.57 - 1.00 mg/dL   eGFR 78 >81 XB/JYN/8.29   BUN/Creatinine Ratio 18 9 - 23   Sodium 143 134 - 144 mmol/L   Potassium 4.6 3.5 - 5.2 mmol/L   Chloride 103 96 - 106 mmol/L   CO2 22 20 - 29 mmol/L   Calcium 9.7 8.7 - 10.2 mg/dL   Total Protein 7.1 6.0 - 8.5 g/dL   Albumin 4.3 3.8 - 4.9 g/dL   Globulin, Total 2.8 1.5 - 4.5 g/dL   Bilirubin Total 0.4 0.0 - 1.2 mg/dL   Alkaline Phosphatase 75 44 - 121 IU/L   AST 23 0 - 40 IU/L   ALT 18 0 - 32 IU/L      Assessment & Plan:   Problem List Items Addressed This Visit       Other   Anxiety disorder - Primary   Under good control on current regimen. Continue current regimen. Continue to monitor. Call with any concerns. Refills given for 3 months. Follow up 3 months.        Relevant Medications   buPROPion  (WELLBUTRIN  XL) 150 MG 24 hr tablet   Overweight (BMI 25.0-29.9)   Tolerating her wegovy  well. Continue wegovy . Call with any concerns. Refills given.         Follow up plan: Return in about 3 months (around 06/08/2024) for physical.

## 2024-04-03 ENCOUNTER — Encounter: Payer: Self-pay | Admitting: Family Medicine

## 2024-04-06 MED ORDER — WEGOVY 0.5 MG/0.5ML ~~LOC~~ SOAJ: 0.5000 mg | SUBCUTANEOUS | 0 refills | Status: DC

## 2024-04-21 ENCOUNTER — Other Ambulatory Visit: Payer: Self-pay | Admitting: Family Medicine

## 2024-04-22 NOTE — Telephone Encounter (Signed)
 Requested Prescriptions  Pending Prescriptions Disp Refills   naproxen  (NAPROSYN ) 500 MG tablet [Pharmacy Med Name: Naproxen  500 MG Oral Tablet] 180 tablet 0    Sig: TAKE 1 TABLET BY MOUTH TWICE  DAILY WITH MEALS     Analgesics:  NSAIDS Failed - 04/22/2024  9:40 AM      Failed - Manual Review: Labs are only required if the patient has taken medication for more than 8 weeks.      Failed - PLT in normal range and within 360 days    Platelets  Date Value Ref Range Status  12/16/2023 465 (H) 150 - 450 x10E3/uL Final         Passed - Cr in normal range and within 360 days    Creatinine, Ser  Date Value Ref Range Status  12/16/2023 0.89 0.57 - 1.00 mg/dL Final         Passed - HGB in normal range and within 360 days    Hemoglobin  Date Value Ref Range Status  12/16/2023 13.9 11.1 - 15.9 g/dL Final         Passed - HCT in normal range and within 360 days    Hematocrit  Date Value Ref Range Status  12/16/2023 43.5 34.0 - 46.6 % Final         Passed - eGFR is 30 or above and within 360 days    GFR calc Af Amer  Date Value Ref Range Status  04/19/2020 91 >59 mL/min/1.73 Final    Comment:    **[facility] currently reports eGFR in compliance with the current**   recommendations of the [employer]. [facility] will   update reporting as new guidelines are published from the NKF-ASN   Task force.    GFR calc non Af Amer  Date Value Ref Range Status  04/19/2020 79 >59 mL/min/1.73 Final   eGFR  Date Value Ref Range Status  12/16/2023 78 >59 mL/min/1.73 Final         Passed - Patient is not pregnant      Passed - Valid encounter within last 12 months    Recent Outpatient Visits           1 month ago Generalized anxiety disorder   [place] [facility] [place], [name], DO   4 months ago Generalized anxiety disorder   [place] [facility] [place], [place], DO

## 2024-05-05 ENCOUNTER — Other Ambulatory Visit: Payer: Self-pay | Admitting: Family Medicine

## 2024-05-06 NOTE — Telephone Encounter (Signed)
 Requested medication (s) are due for refill today: yes   Requested medication (s) are on the active medication list: yes   Last refill:  03/08/24 #30 with 0 refills   Future visit scheduled: yes   Notes to clinic:  Please review for refill. Refill not delegated per protocol    Requested Prescriptions  Pending Prescriptions Disp Refills   clonazePAM (KLONOPIN) 0.5 MG tablet [Pharmacy Med Name: clonazePAM 0.5 MG Oral Tablet] 30 tablet     Sig: TAKE 1/2 TO 1 TABLET BY MOUTH  TWICE DAILY AS NEEDED FOR  ANXIETY     Not Delegated - Psychiatry: Anxiolytics/Hypnotics 2 Failed - 05/06/2024  6:15 PM      Failed - This refill cannot be delegated      Failed - Urine Drug Screen completed in last 360 days      Passed - Patient is not pregnant      Passed - Valid encounter within last 6 months    Recent Outpatient Visits           1 month ago Generalized anxiety disorder   [place] [facility] [place], [name], DO   4 months ago Generalized anxiety disorder   [place] [facility] [place], [place], DO

## 2024-05-11 ENCOUNTER — Encounter: Payer: Self-pay | Admitting: Family Medicine

## 2024-05-11 ENCOUNTER — Other Ambulatory Visit

## 2024-05-11 DIAGNOSIS — Z0000 Encounter for general adult medical examination without abnormal findings: Secondary | ICD-10-CM

## 2024-05-11 DIAGNOSIS — Z131 Encounter for screening for diabetes mellitus: Secondary | ICD-10-CM

## 2024-05-12 LAB — COMPREHENSIVE METABOLIC PANEL WITH GFR
ALT: 13 IU/L (ref 0–32)
AST: 19 IU/L (ref 0–40)
Albumin: 4.2 g/dL (ref 3.8–4.9)
Alkaline Phosphatase: 59 IU/L (ref 44–121)
BUN/Creatinine Ratio: 14 (ref 9–23)
BUN: 13 mg/dL (ref 6–24)
Bilirubin Total: 0.4 mg/dL (ref 0.0–1.2)
CO2: 23 mmol/L (ref 20–29)
Calcium: 9.7 mg/dL (ref 8.7–10.2)
Chloride: 104 mmol/L (ref 96–106)
Creatinine, Ser: 0.92 mg/dL (ref 0.57–1.00)
Globulin, Total: 2.8 g/dL (ref 1.5–4.5)
Glucose: 74 mg/dL (ref 70–99)
Potassium: 5.4 mmol/L — ABNORMAL HIGH (ref 3.5–5.2)
Sodium: 141 mmol/L (ref 134–144)
Total Protein: 7 g/dL (ref 6.0–8.5)
eGFR: 75 mL/min/1.73 (ref >59)

## 2024-05-12 LAB — CBC WITH DIFFERENTIAL/PLATELET
Basophils Absolute: 0.1 x10E3/uL (ref 0.0–0.2)
Basos: 1 %
EOS (ABSOLUTE): 0.2 x10E3/uL (ref 0.0–0.4)
Eos: 2 %
Hematocrit: 44 % (ref 34.0–46.6)
Hemoglobin: 14 g/dL (ref 11.1–15.9)
Immature Grans (Abs): 0 x10E3/uL (ref 0.0–0.1)
Immature Granulocytes: 0 %
Lymphocytes Absolute: 3.6 x10E3/uL — ABNORMAL HIGH (ref 0.7–3.1)
Lymphs: 34 %
MCH: 28.6 pg (ref 26.6–33.0)
MCHC: 31.8 g/dL (ref 31.5–35.7)
MCV: 90 fL (ref 79–97)
Monocytes Absolute: 0.7 x10E3/uL (ref 0.1–0.9)
Monocytes: 7 %
Neutrophils Absolute: 5.9 x10E3/uL (ref 1.4–7.0)
Neutrophils: 56 %
Platelets: 414 x10E3/uL (ref 150–450)
RBC: 4.89 x10E6/uL (ref 3.77–5.28)
RDW: 13.1 % (ref 11.7–15.4)
WBC: 10.6 x10E3/uL (ref 3.4–10.8)

## 2024-05-12 LAB — LIPID PANEL W/O CHOL/HDL RATIO
Cholesterol, Total: 207 mg/dL — ABNORMAL HIGH (ref 100–199)
HDL: 54 mg/dL (ref >39)
LDL Chol Calc (NIH): 135 mg/dL — ABNORMAL HIGH (ref 0–99)
Triglycerides: 100 mg/dL (ref 0–149)
VLDL Cholesterol Cal: 18 mg/dL (ref 5–40)

## 2024-05-12 LAB — HEMOGLOBIN A1C
Est. average glucose Bld gHb Est-mCnc: 105 mg/dL
Hgb A1c MFr Bld: 5.3 % (ref 4.8–5.6)

## 2024-05-12 LAB — TSH: TSH: 2.37 u[IU]/mL (ref 0.450–4.500)

## 2024-05-20 ENCOUNTER — Ambulatory Visit: Payer: Self-pay | Admitting: Family Medicine

## 2024-06-23 ENCOUNTER — Encounter: Admitting: Family Medicine

## 2024-07-12 ENCOUNTER — Other Ambulatory Visit: Payer: Self-pay | Admitting: Family Medicine

## 2024-07-14 NOTE — Telephone Encounter (Signed)
 Too soon for refill, LRF 03/08/24 for 90 and 1 RF.  Requested Prescriptions  Pending Prescriptions Disp Refills   buPROPion  (WELLBUTRIN  XL) 150 MG 24 hr tablet [Pharmacy Med Name: buPROPion  HCl ER (XL) 150 MG Oral Tablet Extended Release 24 Hour] 90 tablet 3    Sig: TAKE 1 TABLET BY MOUTH DAILY     Psychiatry: Antidepressants - bupropion  Passed - 07/14/2024  3:46 PM      Passed - Cr in normal range and within 360 days    Creatinine, Ser  Date Value Ref Range Status  05/11/2024 0.92 0.57 - 1.00 mg/dL Final         Passed - AST in normal range and within 360 days    AST  Date Value Ref Range Status  05/11/2024 19 0 - 40 IU/L Final         Passed - ALT in normal range and within 360 days    ALT  Date Value Ref Range Status  05/11/2024 13 0 - 32 IU/L Final         Passed - Last BP in normal range    BP Readings from Last 1 Encounters:  03/08/24 116/75         Passed - Valid encounter within last 6 months    Recent Outpatient Visits           4 months ago Generalized anxiety disorder   [place] [facility] [place], [name], DO   7 months ago Generalized anxiety disorder   [place] [address] [facility] [place], [place], DO

## 2024-07-15 ENCOUNTER — Other Ambulatory Visit: Payer: Self-pay | Admitting: Family Medicine

## 2024-07-15 ENCOUNTER — Encounter: Payer: Self-pay | Admitting: Family Medicine

## 2024-07-15 MED ORDER — BUPROPION HCL ER (XL) 150 MG PO TB24: 150.0000 mg | ORAL_TABLET | Freq: Every day | ORAL | 1 refills | Status: DC

## 2024-07-16 NOTE — Telephone Encounter (Signed)
 Requested Prescriptions  Refused Prescriptions Disp Refills   buPROPion  (WELLBUTRIN  XL) 150 MG 24 hr tablet [Pharmacy Med Name: buPROPion  HCl ER (XL) 150 MG Oral Tablet Extended Release 24 Hour] 90 tablet 3    Sig: TAKE 1 TABLET BY MOUTH DAILY     Psychiatry: Antidepressants - bupropion  Passed - 07/16/2024  2:56 PM      Passed - Cr in normal range and within 360 days    Creatinine, Ser  Date Value Ref Range Status  05/11/2024 0.92 0.57 - 1.00 mg/dL Final         Passed - AST in normal range and within 360 days    AST  Date Value Ref Range Status  05/11/2024 19 0 - 40 IU/L Final         Passed - ALT in normal range and within 360 days    ALT  Date Value Ref Range Status  05/11/2024 13 0 - 32 IU/L Final         Passed - Last BP in normal range    BP Readings from Last 1 Encounters:  03/08/24 116/75         Passed - Valid encounter within last 6 months    Recent Outpatient Visits           4 months ago Generalized anxiety disorder   [place] [facility] [place], [name], DO   7 months ago Generalized anxiety disorder   [place] [facility] [place], [place], DO

## 2024-08-09 ENCOUNTER — Encounter: Payer: Self-pay | Admitting: Family Medicine

## 2024-08-25 ENCOUNTER — Encounter: Payer: Self-pay | Admitting: Family Medicine

## 2024-08-25 NOTE — Telephone Encounter (Signed)
 Can we send this to the PA team please- she may need a follow up as she hasn't been seen since June

## 2024-08-26 ENCOUNTER — Telehealth: Payer: Self-pay | Admitting: Pharmacy Technician

## 2024-08-26 ENCOUNTER — Other Ambulatory Visit (HOSPITAL_COMMUNITY): Payer: Self-pay

## 2024-08-26 NOTE — Telephone Encounter (Signed)
 Pharmacy Patient Advocate Encounter   Received notification from Patient Advice Request messages that prior authorization for wegovy  is required/requested.   Insurance verification completed.   The patient is insured through [employer].   Per test claim: The current 28 day co-pay is, $24.99.  No PA needed at this time. This test claim was processed through [facility]- copay amounts may vary at other pharmacies due to pharmacy/plan contracts, or as the patient moves through the different stages of their insurance plan.    I called the pharmacy to see what issues they were having and they said the pt must enroll in Weight watchers through [facility]. I called and left the pt a voice mail in regard to this.

## 2024-08-26 NOTE — Telephone Encounter (Signed)
 PA request has been Received. New Encounter has been or will be created for follow up. For additional info see Pharmacy Prior Auth telephone encounter from 08/26/24.

## 2024-08-27 ENCOUNTER — Other Ambulatory Visit: Payer: Self-pay | Admitting: Family Medicine

## 2024-08-27 ENCOUNTER — Other Ambulatory Visit: Payer: Self-pay

## 2024-08-30 NOTE — Telephone Encounter (Signed)
 Requested medication (s) are due for refill today:Yes  Requested medication (s) are on the active medication list:Yes  Last refill:  03/08/2024  Future visit scheduled: Yes  Notes to clinic:  10/06/2024     Requested Renewals   Name from pharmacy: ONDANSETRON   4MG   TAB       Will file in chart as: ondansetron  (ZOFRAN ) 4 MG tablet   Sig: TAKE 1 TABLET BY MOUTH EVERY 8  HOURS AS NEEDED FOR NAUSEA AND  VOMITING   Disp: 80 tablet    Refills: Not specified   Start: 08/27/2024   Class: Normal   Non-formulary   Last ordered: 5 months ago (03/08/2024) by Duwaine SHAUNNA Louder, DO   Last refill: 03/08/2024   Rx #: [id number]   Not Delegated - Gastroenterology: Antiemetics - ondansetron  Failed12/01/2024 12:31 PM  Protocol Details This refill cannot be delegated   AST in normal range and within 360 days   ALT in normal range and within 360 days   Valid encounter within last 6 months    To be filled at: [facility] [place],  - [address]

## 2024-08-31 ENCOUNTER — Other Ambulatory Visit: Payer: Self-pay

## 2024-08-31 MED ORDER — WEGOVY 1 MG/0.5ML ~~LOC~~ SOAJ: 1.0000 mg | SUBCUTANEOUS | 1 refills | Status: DC

## 2024-09-15 ENCOUNTER — Other Ambulatory Visit: Payer: Self-pay | Admitting: Family Medicine

## 2024-09-21 ENCOUNTER — Other Ambulatory Visit: Payer: Self-pay | Admitting: Family Medicine

## 2024-09-22 ENCOUNTER — Other Ambulatory Visit: Payer: Self-pay

## 2024-09-22 ENCOUNTER — Encounter: Payer: Self-pay | Admitting: Family Medicine

## 2024-09-22 NOTE — Telephone Encounter (Signed)
 Requested by patient. Courtesy refill. Future visit 10/06/24.  Requested Prescriptions  Pending Prescriptions Disp Refills   WEGOVY  1 MG/0.5ML SOAJ SQ injection [Pharmacy Med Name: Wegovy  1 MG/0.5ML Subcutaneous Solution Auto-injector] 4 mL 0    Sig: INJECT ONE SYRINGEFUL UNDER THE SKIN ONCE WEEKLY     Endocrinology:  Diabetes - GLP-1 Receptor Agonists - semaglutide  Failed - 09/22/2024  3:50 PM      Failed - Valid encounter within last 6 months    Recent Outpatient Visits           6 months ago Generalized anxiety disorder   [place] [facility] [place], [name], DO   9 months ago Generalized anxiety disorder   [place] [facility] [place], [name], DO              Passed - HBA1C in normal range and within 180 days    HB A1C (BAYER DCA - WAIVED)  Date Value Ref Range Status  05/16/2023 5.6 4.8 - 5.6 % Final    Comment:             Prediabetes: 5.7 - 6.4          Diabetes: >6.4          Glycemic control for adults with diabetes: <7.0    Hgb A1c MFr Bld  Date Value Ref Range Status  05/11/2024 5.3 4.8 - 5.6 % Final    Comment:             Prediabetes: 5.7 - 6.4          Diabetes: >6.4          Glycemic control for adults with diabetes: <7.0          Passed - Cr in normal range and within 360 days    Creatinine, Ser  Date Value Ref Range Status  05/11/2024 0.92 0.57 - 1.00 mg/dL Final

## 2024-10-06 ENCOUNTER — Encounter: Payer: Self-pay | Admitting: Family Medicine

## 2024-10-06 ENCOUNTER — Ambulatory Visit: Admitting: Family Medicine

## 2024-10-06 VITALS — BP 128/87 | HR 85 | Temp 98.0°F | Ht 64.0 in | Wt 158.8 lb

## 2024-10-06 DIAGNOSIS — E782 Mixed hyperlipidemia: Secondary | ICD-10-CM | POA: Diagnosis not present

## 2024-10-06 DIAGNOSIS — Z1231 Encounter for screening mammogram for malignant neoplasm of breast: Secondary | ICD-10-CM | POA: Diagnosis not present

## 2024-10-06 DIAGNOSIS — Z0000 Encounter for general adult medical examination without abnormal findings: Secondary | ICD-10-CM

## 2024-10-06 DIAGNOSIS — E663 Overweight: Secondary | ICD-10-CM | POA: Diagnosis not present

## 2024-10-06 DIAGNOSIS — Z23 Encounter for immunization: Secondary | ICD-10-CM | POA: Diagnosis not present

## 2024-10-06 DIAGNOSIS — F411 Generalized anxiety disorder: Secondary | ICD-10-CM

## 2024-10-06 MED ORDER — BUPROPION HCL ER (SR) 150 MG PO TB12: 150.0000 mg | ORAL_TABLET | Freq: Two times a day (BID) | ORAL | 1 refills | Status: AC

## 2024-10-06 MED ORDER — WEGOVY 1 MG/0.5ML ~~LOC~~ SOAJ: 1.0000 mg | SUBCUTANEOUS | 1 refills | Status: DC

## 2024-10-06 MED ORDER — CLONAZEPAM 0.5 MG PO TABS: 0.2500 mg | ORAL_TABLET | Freq: Two times a day (BID) | ORAL | 0 refills | Status: AC | PRN

## 2024-10-06 NOTE — Assessment & Plan Note (Signed)
 Exacerbated with current events. Will continue current regimen. Continue to monitor. Call with any concerns. Refills sent to her pharmacy.

## 2024-10-06 NOTE — Patient Instructions (Signed)
 Please call to schedule your mammogram and/or bone density: [facility] at [facility]  Address: [address], [place], [place] [address] Phone: [phone]  [facility] [address] [place],  [place]  [address] Phone: [phone]

## 2024-10-06 NOTE — Assessment & Plan Note (Signed)
 Congratulated patient on 63lb weight loss! Tolerating her wegovy  well. Will continue. Call with any concerns. Continue to monitor.

## 2024-10-06 NOTE — Assessment & Plan Note (Signed)
 Rechecking labs today. Await results. Treat as needed.

## 2024-10-06 NOTE — Progress Notes (Signed)
 "  BP 128/87   Pulse 85   Temp 98 F (36.7 C) (Oral)   Ht 5' 4 (1.626 m)   Wt 158 lb 12.8 oz (72 kg)   SpO2 99%   BMI 27.26 kg/m    Subjective:    Patient ID: Heidi Taylor, female    DOB: [birth year], 54 y.o.   MRN: [id number]  HPI: Heidi Taylor is a 54 y.o. female presenting on 10/06/2024 for comprehensive medical examination. Current medical complaints include:  OVERWEIGHT Duration: chronic Previous attempts at weight loss: yes Complications of obesity: HLD, depression, lumbar DJD  Peak weight: 221lbs Weight loss goal: 145lbs Weight loss to date: 63lbs! Requesting obesity pharmacotherapy: yes Current weight loss supplements/medications: yes Past weight loss medications: yes- wegovy   ANXIETY/DEPRESSION Duration: chronic Status:exacerbated Anxious mood: yes  Excessive worrying: yes Irritability: no  Sweating: no Nausea: no Palpitations:no Hyperventilation: no Panic attacks: no Agoraphobia: no  Obscessions/compulsions: no Depressed mood: yes    10/06/2024   10:22 AM 03/08/2024    1:30 PM 12/16/2023    9:13 AM 05/16/2023    9:54 AM 12/10/2022   10:34 AM  Depression screen PHQ 2/9  Decreased Interest 1 1 2 1 1   Down, Depressed, Hopeless 1 1 1  0 1  PHQ - 2 Score 2 2 3 1 2   Altered sleeping 2 1 3 1 2   Tired, decreased energy 1 2 1 1 1   Change in appetite 1 1 2  0 0  Feeling bad or failure about yourself  0 0 0 0 0  Trouble concentrating 0 0 0 0 0  Moving slowly or fidgety/restless 0 0 0 0 0  Suicidal thoughts 0 0 0 0 0  PHQ-9 Score 6 6  9  3  5    Difficult doing work/chores Somewhat difficult Somewhat difficult Somewhat difficult Somewhat difficult Somewhat difficult     Data saved with a previous flowsheet row definition      10/06/2024   10:23 AM 03/08/2024    1:30 PM 12/16/2023    9:13 AM 05/16/2023    9:54 AM  GAD 7 : Generalized Anxiety Score  Nervous, Anxious, on Edge 2 1 3 1   Control/stop worrying 1 0 3 0  Worry too much - different things 1 0 3 0   Trouble relaxing 1 1 2 1   Restless 0 0 1 0  Easily annoyed or irritable 1 1 1  0  Afraid - awful might happen 1 0 2 0  Total GAD 7 Score 7 3 15 2   Anxiety Difficulty Somewhat difficult Somewhat difficult Very difficult Somewhat difficult   Anhedonia: no Weight changes: no Insomnia: no   Hypersomnia: no Fatigue/loss of energy: yes Feelings of worthlessness: no Feelings of guilt: no Impaired concentration/indecisiveness: no Suicidal ideations: no  Crying spells: no Recent Stressors/Life Changes: yes   Relationship problems: no   Family stress: no     Financial stress: no    Job stress: no    Recent death/loss: no  She currently lives with: husband Menopausal Symptoms: no  Depression Screen done today and results listed below:     10/06/2024   10:22 AM 03/08/2024    1:30 PM 12/16/2023    9:13 AM 05/16/2023    9:54 AM 12/10/2022   10:34 AM  Depression screen PHQ 2/9  Decreased Interest 1 1 2 1 1   Down, Depressed, Hopeless 1 1 1  0 1  PHQ - 2 Score 2 2 3 1 2   Altered sleeping 2 1 3  1  2  Tired, decreased energy 1 2 1 1 1   Change in appetite 1 1 2  0 0  Feeling bad or failure about yourself  0 0 0 0 0  Trouble concentrating 0 0 0 0 0  Moving slowly or fidgety/restless 0 0 0 0 0  Suicidal thoughts 0 0 0 0 0  PHQ-9 Score 6 6  9  3  5    Difficult doing work/chores Somewhat difficult Somewhat difficult Somewhat difficult Somewhat difficult Somewhat difficult     Data saved with a previous flowsheet row definition    Past Medical History:  Past Medical History:  Diagnosis Date   Allergy Unknown   Antidepressants I've tried have caused unwanted side effects   Anxiety    General anxiety for as long as I can remember.   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   GERD (gastroesophageal reflux disease)    Occasionally from certain food   Hyperlipidemia     Surgical History:   Past Surgical History:  Procedure Laterality Date   HERNIA REPAIR  1977    Medications:  Current Outpatient Medications on File Prior to Visit  Medication Sig   cetirizine (ZYRTEC) 10 MG tablet Take 10 mg by mouth daily.   clindamycin (CLEOCIN T) 1 % lotion Apply topically daily as needed.   cyclobenzaprine  (FLEXERIL ) 10 MG tablet TAKE 1 TABLET(10 MG) BY MOUTH AT BEDTIME   fluticasone (FLONASE) 50 MCG/ACT nasal spray Place into the nose.   gabapentin (NEURONTIN) 300 MG capsule Take 300 mg by mouth as needed.   naproxen  (NAPROSYN ) 500 MG tablet TAKE 1 TABLET BY MOUTH TWICE  DAILY WITH MEALS   ondansetron  (ZOFRAN ) 4 MG tablet TAKE 1 TABLET BY MOUTH EVERY 8  HOURS AS NEEDED FOR NAUSEA AND  VOMITING   No current facility-administered medications on file prior to visit.    Allergies:  Allergies[1]  Social History:  Social History   Socioeconomic History   Marital status: Married    Spouse name: Manus   Number of children: Not on file   Years of education: Not on file   Highest education level: Associate degree: academic program  Occupational History   Not on file  Tobacco Use   Smoking status: Former    Current packs/day: 0.00    Types: Cigarettes    Quit date: 09/06/2009    Years since quitting: 15.0   Smokeless tobacco: Never  Vaping Use   Vaping status: Never Used  Substance and Sexual Activity   Alcohol use: Not Currently    Comment: Rarely on special occasion   Drug use: Never   Sexual activity: Not Currently    Birth control/protection: None  Other Topics Concern   Not on file  Social History Narrative   Not on file   Social Drivers of Health   Tobacco Use: Medium Risk (10/06/2024)   Patient History    Smoking Tobacco Use: Former    Smokeless Tobacco Use: Never    Passive Exposure: Not on Actuary Strain: Low Risk (10/05/2024)   Overall Financial Resource Strain (CARDIA)    Difficulty of Paying Living Expenses: Not hard at all  Food  Insecurity: No Food Insecurity (10/05/2024)   Epic    Worried About Radiation Protection Practitioner of Food in the Last Year: Never true    Ran Out of Food in the Last Year: Never  true  Transportation Needs: No Transportation Needs (10/05/2024)   Epic    Lack of Transportation (Medical): No    Lack of Transportation (Non-Medical): No  Physical Activity: Insufficiently Active (10/05/2024)   Exercise Vital Sign    Days of Exercise per Week: 2 days    Minutes of Exercise per Session: 40 min  Stress: Stress Concern Present (10/05/2024)   [employer] of Occupational Health - Occupational Stress Questionnaire    Feeling of Stress: To some extent  Social Connections: Unknown (10/05/2024)   Social Connection and Isolation Panel    Frequency of Communication with Friends and Family: Patient declined    Frequency of Social Gatherings with Friends and Family: Patient declined    Attends Religious Services: Patient declined    Active Member of Clubs or Organizations: No    Attends Engineer, Structural: Not on file    Marital Status: Married  Catering Manager Violence: Not At Risk (05/16/2023)   Humiliation, Afraid, Rape, and Kick questionnaire    Fear of Current or Ex-Partner: No    Emotionally Abused: No    Physically Abused: No    Sexually Abused: No  Depression (PHQ2-9): Medium Risk (10/06/2024)   Depression (PHQ2-9)    PHQ-2 Score: 6  Alcohol Screen: Low Risk (10/05/2024)   Alcohol Screen    Last Alcohol Screening Score (AUDIT): 0  Housing: Low Risk (10/05/2024)   Epic    Unable to Pay for Housing in the Last Year: No    Number of Times Moved in the Last Year: 0    Homeless in the Last Year: No  Utilities: Not At Risk (05/16/2023)   [facility] Utilities    Threatened with loss of utilities: No  Health Literacy: Adequate Health Literacy (05/16/2023)   B1300 Health Literacy    Frequency of need for help with medical instructions: Never   Tobacco Use History[2] Social History   Substance and Sexual  Activity  Alcohol Use Not Currently   Comment: Rarely on special occasion    Family History:  Family History  Problem Relation Age of Onset   Cancer Mother        Brain   Early death Mother    Parkinson's disease Father    Anxiety disorder Father    Arthritis Father    Breast cancer Maternal Aunt    Cancer Maternal Aunt    Parkinson's disease Paternal Grandfather    ADD / ADHD Son    Early death Son    Anxiety disorder Sister    Arthritis Sister    Depression Sister    Obesity Sister    Arthritis Sister    Depression Sister    Diabetes Sister    Heart disease Sister    Obesity Sister     Past medical history, surgical history, medications, allergies, family history and social history reviewed with patient today and changes made to appropriate areas of the chart.   Review of Systems  Constitutional: Negative.   HENT: Negative.    Eyes: Negative.   Respiratory: Negative.    Cardiovascular: Negative.   Gastrointestinal:  Positive for nausea. Negative for abdominal pain, blood in stool, constipation, diarrhea, heartburn, melena and vomiting.  Genitourinary: Negative.   Musculoskeletal: Negative.   Skin: Negative.   Neurological: Negative.   Endo/Heme/Allergies:  Positive for environmental allergies. Negative for polydipsia. Does not bruise/bleed easily.  Psychiatric/Behavioral: Negative.     All other ROS negative except what is listed above and in the HPI.  Objective:    BP 128/87   Pulse 85   Temp 98 F (36.7 C) (Oral)   Ht 5' 4 (1.626 m)   Wt 158 lb 12.8 oz (72 kg)   SpO2 99%   BMI 27.26 kg/m   Wt Readings from Last 3 Encounters:  10/06/24 158 lb 12.8 oz (72 kg)  03/08/24 168 lb 3.2 oz (76.3 kg)  12/16/23 169 lb 3.2 oz (76.7 kg)    Physical Exam Vitals and nursing note reviewed.  Constitutional:      General: She is not in acute distress.    Appearance: Normal appearance. She is not ill-appearing, toxic-appearing or diaphoretic.  HENT:      Head: Normocephalic and atraumatic.     Right Ear: Tympanic membrane, ear canal and external ear normal. There is no impacted cerumen.     Left Ear: Tympanic membrane, ear canal and external ear normal. There is no impacted cerumen.     Nose: Nose normal. No congestion or rhinorrhea.     Mouth/Throat:     Mouth: Mucous membranes are moist.     Pharynx: Oropharynx is clear. No oropharyngeal exudate or posterior oropharyngeal erythema.  Eyes:     General: No scleral icterus.       Right eye: No discharge.        Left eye: No discharge.     Extraocular Movements: Extraocular movements intact.     Conjunctiva/sclera: Conjunctivae normal.     Pupils: Pupils are equal, Taylor, and reactive to light.  Neck:     Vascular: No carotid bruit.  Cardiovascular:     Rate and Rhythm: Normal rate and regular rhythm.     Pulses: Normal pulses.     Heart sounds: No murmur heard.    No friction rub. No gallop.  Pulmonary:     Effort: Pulmonary effort is normal. No respiratory distress.     Breath sounds: Normal breath sounds. No stridor. No wheezing, rhonchi or rales.  Chest:     Chest wall: No tenderness.  Abdominal:     General: Abdomen is flat. Bowel sounds are normal. There is no distension.     Palpations: Abdomen is soft. There is no mass.     Tenderness: There is no abdominal tenderness. There is no right CVA tenderness, left CVA tenderness, guarding or rebound.     Hernia: No hernia is present.  Genitourinary:    Comments: Breast and pelvic exams deferred with shared decision making Musculoskeletal:        General: No swelling, tenderness, deformity or signs of injury.     Cervical back: Normal range of motion and neck supple. No rigidity. No muscular tenderness.     Right lower leg: No edema.     Left lower leg: No edema.  Lymphadenopathy:     Cervical: No cervical adenopathy.  Skin:    General: Skin is warm and dry.     Capillary Refill: Capillary refill takes less than 2 seconds.      Coloration: Skin is not jaundiced or pale.     Findings: No bruising, erythema, lesion or rash.  Neurological:     General: No focal deficit present.     Mental Status: She is alert and oriented to person, place, and time. Mental status is at baseline.     Cranial Nerves: No cranial nerve deficit.     Sensory: No sensory deficit.     Motor: No weakness.     Coordination: Coordination normal.  Gait: Gait normal.     Deep Tendon Reflexes: Reflexes normal.  Psychiatric:        Mood and Affect: Mood normal.        Behavior: Behavior normal.        Thought Content: Thought content normal.        Judgment: Judgment normal.     Results for orders placed or performed in visit on 05/11/24  TSH   Collection Time: 05/11/24 11:47 AM  Result Value Ref Range   TSH 2.370 0.450 - 4.500 uIU/mL  Lipid Panel w/o Chol/HDL Ratio   Collection Time: 05/11/24 11:47 AM  Result Value Ref Range   Cholesterol, Total 207 (H) 100 - 199 mg/dL   Triglycerides 899 0 - 149 mg/dL   HDL 54 >60 mg/dL   VLDL Cholesterol Cal 18 5 - 40 mg/dL   LDL Chol Calc (NIH) 864 (H) 0 - 99 mg/dL  Comprehensive metabolic panel with GFR   Collection Time: 05/11/24 11:47 AM  Result Value Ref Range   Glucose 74 70 - 99 mg/dL   BUN 13 6 - 24 mg/dL   Creatinine, Ser 9.07 0.57 - 1.00 mg/dL   eGFR 75 >40 fO/fpw/8.26   BUN/Creatinine Ratio 14 9 - 23   Sodium 141 134 - 144 mmol/L   Potassium 5.4 (H) 3.5 - 5.2 mmol/L   Chloride 104 96 - 106 mmol/L   CO2 23 20 - 29 mmol/L   Calcium 9.7 8.7 - 10.2 mg/dL   Total Protein 7.0 6.0 - 8.5 g/dL   Albumin 4.2 3.8 - 4.9 g/dL   Globulin, Total 2.8 1.5 - 4.5 g/dL   Bilirubin Total 0.4 0.0 - 1.2 mg/dL   Alkaline Phosphatase 59 44 - 121 IU/L   AST 19 0 - 40 IU/L   ALT 13 0 - 32 IU/L  CBC with Differential/Platelet   Collection Time: 05/11/24 11:47 AM  Result Value Ref Range   WBC 10.6 3.4 - 10.8 x10E3/uL   RBC 4.89 3.77 - 5.28 x10E6/uL   Hemoglobin 14.0 11.1 - 15.9 g/dL    Hematocrit 55.9 65.9 - 46.6 %   MCV 90 79 - 97 fL   MCH 28.6 26.6 - 33.0 pg   MCHC 31.8 31.5 - 35.7 g/dL   RDW 86.8 88.2 - 84.5 %   Platelets 414 150 - 450 x10E3/uL   Neutrophils 56 Not Estab. %   Lymphs 34 Not Estab. %   Monocytes 7 Not Estab. %   Eos 2 Not Estab. %   Basos 1 Not Estab. %   Neutrophils Absolute 5.9 1.4 - 7.0 x10E3/uL   Lymphocytes Absolute 3.6 (H) 0.7 - 3.1 x10E3/uL   Monocytes Absolute 0.7 0.1 - 0.9 x10E3/uL   EOS (ABSOLUTE) 0.2 0.0 - 0.4 x10E3/uL   Basophils Absolute 0.1 0.0 - 0.2 x10E3/uL   Immature Granulocytes 0 Not Estab. %   Immature Grans (Abs) 0.0 0.0 - 0.1 x10E3/uL  HgB A1c   Collection Time: 05/11/24 11:47 AM  Result Value Ref Range   Hgb A1c MFr Bld 5.3 4.8 - 5.6 %   Est. average glucose Bld gHb Est-mCnc 105 mg/dL      Assessment & Plan:   Problem List Items Addressed This Visit       Other   Anxiety disorder   Exacerbated with current events. Will continue current regimen. Continue to monitor. Call with any concerns. Refills sent to her pharmacy.       Relevant Medications   buPROPion  (  WELLBUTRIN  SR) 150 MG 12 hr tablet   HLD (hyperlipidemia)   Rechecking labs today. Await results. Treat as needed.       Overweight (BMI 25.0-29.9)   Congratulated patient on 63lb weight loss! Tolerating her wegovy  well. Will continue. Call with any concerns. Continue to monitor.       Other Visit Diagnoses       Routine general medical examination at a health care facility    -  Primary   Vaccines up to date. Screening labs checked today. Pap and cologuard up to date. Mammogram ordered. Continue diet and exercise. Call with any concerns.   Relevant Orders   CBC with Differential/Platelet   Comprehensive metabolic panel with GFR   Lipid Panel w/o Chol/HDL Ratio   TSH   Hepatitis B surface antibody,quantitative     Need for pneumococcal vaccination       Prevnar given today.   Relevant Orders   Pneumococcal conjugate vaccine 20-valent (Prevnar 20)  (Completed)     Encounter for screening mammogram for malignant neoplasm of breast       Mammogram ordered today.   Relevant Orders   MM 3D SCREENING MAMMOGRAM BILATERAL BREAST        Follow up plan: Return in about 6 months (around 04/05/2025).   LABORATORY TESTING:  - Pap smear: up to date  IMMUNIZATIONS:   - Tdap: Tetanus vaccination status reviewed: last tetanus booster within 10 years. - Influenza: Refused - Prevnar: Administered today - COVID: Refused - HPV: Not applicable - Shingrix  vaccine: Up to date  SCREENING: -Mammogram: Ordered today  - Colonoscopy: Up to date   PATIENT COUNSELING:   Advised to take 1 mg of folate supplement per day if capable of pregnancy.   Sexuality: Discussed sexually transmitted diseases, partner selection, use of condoms, avoidance of unintended pregnancy  and contraceptive alternatives.   Advised to avoid cigarette smoking.  I discussed with the patient that most people either abstain from alcohol or drink within safe limits (<=14/week and <=4 drinks/occasion for males, <=7/weeks and <= 3 drinks/occasion for females) and that the risk for alcohol disorders and other health effects rises proportionally with the number of drinks per week and how often a drinker exceeds daily limits.  Discussed cessation/primary prevention of drug use and availability of treatment for abuse.   Diet: Encouraged to adjust caloric intake to maintain  or achieve ideal body weight, to reduce intake of dietary saturated fat and total fat, to limit sodium intake by avoiding high sodium foods and not adding table salt, and to maintain adequate dietary potassium and calcium preferably from fresh fruits, vegetables, and low-fat dairy products.    stressed the importance of regular exercise  Injury prevention: Discussed safety belts, safety helmets, smoke detector, smoking near bedding or upholstery.   Dental health: Discussed importance of regular tooth brushing,  flossing, and dental visits.    NEXT PREVENTATIVE PHYSICAL DUE IN 1 YEAR. Return in about 6 months (around 04/05/2025).              [1]  Allergies Allergen Reactions   Duloxetine Other (See Comments)    GI and weight   Sertraline Other (See Comments)    GI and weight   Trintellix  [Vortioxetine ] Nausea And Vomiting  [2]  Social History Tobacco Use  Smoking Status Former   Current packs/day: 0.00   Types: Cigarettes   Quit date: 09/06/2009   Years since quitting: 15.0  Smokeless Tobacco Never   "

## 2024-10-07 ENCOUNTER — Ambulatory Visit: Payer: Self-pay | Admitting: Family Medicine

## 2024-10-07 ENCOUNTER — Encounter: Payer: Self-pay | Admitting: Family Medicine

## 2024-10-07 LAB — COMPREHENSIVE METABOLIC PANEL WITH GFR
ALT: 11 IU/L (ref 0–32)
AST: 19 IU/L (ref 0–40)
Albumin: 4.8 g/dL (ref 3.8–4.9)
Alkaline Phosphatase: 66 IU/L (ref 49–135)
BUN/Creatinine Ratio: 13 (ref 9–23)
BUN: 13 mg/dL (ref 6–24)
Bilirubin Total: 0.8 mg/dL (ref 0.0–1.2)
CO2: 21 mmol/L (ref 20–29)
Calcium: 9.9 mg/dL (ref 8.7–10.2)
Chloride: 101 mmol/L (ref 96–106)
Creatinine, Ser: 0.97 mg/dL (ref 0.57–1.00)
Globulin, Total: 2.9 g/dL (ref 1.5–4.5)
Glucose: 81 mg/dL (ref 70–99)
Potassium: 4.7 mmol/L (ref 3.5–5.2)
Sodium: 140 mmol/L (ref 134–144)
Total Protein: 7.7 g/dL (ref 6.0–8.5)
eGFR: 70 mL/min/1.73

## 2024-10-07 LAB — CBC WITH DIFFERENTIAL/PLATELET
Basophils Absolute: 0.1 x10E3/uL (ref 0.0–0.2)
Basos: 1 %
EOS (ABSOLUTE): 0.1 x10E3/uL (ref 0.0–0.4)
Eos: 1 %
Hematocrit: 45.8 % (ref 34.0–46.6)
Hemoglobin: 14.5 g/dL (ref 11.1–15.9)
Immature Grans (Abs): 0 x10E3/uL (ref 0.0–0.1)
Immature Granulocytes: 0 %
Lymphocytes Absolute: 2.7 x10E3/uL (ref 0.7–3.1)
Lymphs: 33 %
MCH: 28.3 pg (ref 26.6–33.0)
MCHC: 31.7 g/dL (ref 31.5–35.7)
MCV: 90 fL (ref 79–97)
Monocytes Absolute: 0.5 x10E3/uL (ref 0.1–0.9)
Monocytes: 6 %
Neutrophils Absolute: 5 x10E3/uL (ref 1.4–7.0)
Neutrophils: 59 %
Platelets: 452 x10E3/uL — ABNORMAL HIGH (ref 150–450)
RBC: 5.12 x10E6/uL (ref 3.77–5.28)
RDW: 12.9 % (ref 11.7–15.4)
WBC: 8.4 x10E3/uL (ref 3.4–10.8)

## 2024-10-07 LAB — TSH: TSH: 1.79 u[IU]/mL (ref 0.450–4.500)

## 2024-10-07 LAB — LIPID PANEL W/O CHOL/HDL RATIO
Cholesterol, Total: 224 mg/dL — ABNORMAL HIGH (ref 100–199)
HDL: 59 mg/dL
LDL Chol Calc (NIH): 150 mg/dL — ABNORMAL HIGH (ref 0–99)
Triglycerides: 83 mg/dL (ref 0–149)
VLDL Cholesterol Cal: 15 mg/dL (ref 5–40)

## 2024-10-07 LAB — HEPATITIS B SURFACE ANTIBODY, QUANTITATIVE: Hepatitis B Surf Ab Quant: <3.5 m[IU]/mL — ABNORMAL LOW

## 2024-10-20 ENCOUNTER — Other Ambulatory Visit: Payer: Self-pay

## 2024-10-20 ENCOUNTER — Encounter: Payer: Self-pay | Admitting: Family Medicine

## 2024-10-20 MED ORDER — WEGOVY 1 MG/0.5ML ~~LOC~~ SOAJ: 1.0000 mg | SUBCUTANEOUS | 1 refills | Status: AC

## 2024-10-25 ENCOUNTER — Encounter

## 2024-10-29 ENCOUNTER — Encounter: Payer: Self-pay | Admitting: Family Medicine

## 2025-04-05 ENCOUNTER — Ambulatory Visit: Admitting: Family Medicine
